# Patient Record
Sex: Male | Born: 1935 | Race: White | Hispanic: No | Marital: Married | Smoking: Former smoker
Health system: Southern US, Community
[De-identification: ages and names within clinical notes are randomized; demographics above are authoritative.]

## PROBLEM LIST (undated history)

## (undated) DIAGNOSIS — I10 Essential (primary) hypertension: Secondary | ICD-10-CM

## (undated) DIAGNOSIS — E785 Hyperlipidemia, unspecified: Secondary | ICD-10-CM

## (undated) HISTORY — DX: Essential (primary) hypertension: I10

## (undated) HISTORY — PX: CATARACT EXTRACTION: SUR2

## (undated) HISTORY — DX: Hyperlipidemia, unspecified: E78.5

## (undated) HISTORY — PX: TONSILLECTOMY AND ADENOIDECTOMY: SHX28

---

## 1999-05-06 HISTORY — PX: OTHER SURGICAL HISTORY: SHX169

## 1999-06-07 HISTORY — PX: PTCA: SHX146

## 2007-03-25 ENCOUNTER — Ambulatory Visit: Payer: Self-pay | Admitting: Orthopaedic Surgery

## 2009-07-23 ENCOUNTER — Inpatient Hospital Stay: Payer: Self-pay | Admitting: Surgery

## 2009-07-27 HISTORY — PX: ERCP: SHX60

## 2010-06-06 ENCOUNTER — Emergency Department: Payer: Self-pay | Admitting: Emergency Medicine

## 2011-09-05 DIAGNOSIS — K4090 Unilateral inguinal hernia, without obstruction or gangrene, not specified as recurrent: Secondary | ICD-10-CM | POA: Insufficient documentation

## 2012-08-05 DIAGNOSIS — H2510 Age-related nuclear cataract, unspecified eye: Secondary | ICD-10-CM | POA: Diagnosis not present

## 2012-09-06 DIAGNOSIS — E785 Hyperlipidemia, unspecified: Secondary | ICD-10-CM | POA: Diagnosis not present

## 2012-09-06 DIAGNOSIS — Z0000 Encounter for general adult medical examination without abnormal findings: Secondary | ICD-10-CM | POA: Diagnosis not present

## 2012-09-06 DIAGNOSIS — H6120 Impacted cerumen, unspecified ear: Secondary | ICD-10-CM | POA: Diagnosis not present

## 2012-09-06 DIAGNOSIS — Z23 Encounter for immunization: Secondary | ICD-10-CM | POA: Diagnosis not present

## 2012-09-06 DIAGNOSIS — I10 Essential (primary) hypertension: Secondary | ICD-10-CM | POA: Diagnosis not present

## 2012-09-06 DIAGNOSIS — I2510 Atherosclerotic heart disease of native coronary artery without angina pectoris: Secondary | ICD-10-CM | POA: Diagnosis not present

## 2012-09-09 DIAGNOSIS — E785 Hyperlipidemia, unspecified: Secondary | ICD-10-CM | POA: Diagnosis not present

## 2012-09-09 DIAGNOSIS — Z125 Encounter for screening for malignant neoplasm of prostate: Secondary | ICD-10-CM | POA: Diagnosis not present

## 2012-09-25 DIAGNOSIS — H2510 Age-related nuclear cataract, unspecified eye: Secondary | ICD-10-CM | POA: Diagnosis not present

## 2012-10-09 ENCOUNTER — Ambulatory Visit: Payer: Self-pay | Admitting: Ophthalmology

## 2012-10-09 DIAGNOSIS — H2510 Age-related nuclear cataract, unspecified eye: Secondary | ICD-10-CM | POA: Diagnosis not present

## 2012-10-09 DIAGNOSIS — Z87891 Personal history of nicotine dependence: Secondary | ICD-10-CM | POA: Diagnosis not present

## 2012-10-09 DIAGNOSIS — Z79899 Other long term (current) drug therapy: Secondary | ICD-10-CM | POA: Diagnosis not present

## 2012-10-09 DIAGNOSIS — Z7982 Long term (current) use of aspirin: Secondary | ICD-10-CM | POA: Diagnosis not present

## 2012-10-09 DIAGNOSIS — Z880 Allergy status to penicillin: Secondary | ICD-10-CM | POA: Diagnosis not present

## 2012-10-09 DIAGNOSIS — I10 Essential (primary) hypertension: Secondary | ICD-10-CM | POA: Diagnosis not present

## 2012-10-09 DIAGNOSIS — Z9861 Coronary angioplasty status: Secondary | ICD-10-CM | POA: Diagnosis not present

## 2012-10-09 DIAGNOSIS — I2510 Atherosclerotic heart disease of native coronary artery without angina pectoris: Secondary | ICD-10-CM | POA: Diagnosis not present

## 2012-10-09 DIAGNOSIS — E7800 Pure hypercholesterolemia, unspecified: Secondary | ICD-10-CM | POA: Diagnosis not present

## 2013-04-25 DIAGNOSIS — H2510 Age-related nuclear cataract, unspecified eye: Secondary | ICD-10-CM | POA: Diagnosis not present

## 2013-06-17 DIAGNOSIS — H2510 Age-related nuclear cataract, unspecified eye: Secondary | ICD-10-CM | POA: Diagnosis not present

## 2013-06-25 ENCOUNTER — Ambulatory Visit: Payer: Self-pay | Admitting: Ophthalmology

## 2013-06-25 DIAGNOSIS — Z9861 Coronary angioplasty status: Secondary | ICD-10-CM | POA: Diagnosis not present

## 2013-06-25 DIAGNOSIS — H269 Unspecified cataract: Secondary | ICD-10-CM | POA: Diagnosis not present

## 2013-06-25 DIAGNOSIS — I2510 Atherosclerotic heart disease of native coronary artery without angina pectoris: Secondary | ICD-10-CM | POA: Diagnosis not present

## 2013-06-25 DIAGNOSIS — E7800 Pure hypercholesterolemia, unspecified: Secondary | ICD-10-CM | POA: Diagnosis not present

## 2013-06-25 DIAGNOSIS — H2510 Age-related nuclear cataract, unspecified eye: Secondary | ICD-10-CM | POA: Diagnosis not present

## 2013-06-25 DIAGNOSIS — Z79899 Other long term (current) drug therapy: Secondary | ICD-10-CM | POA: Diagnosis not present

## 2013-06-25 DIAGNOSIS — Z87891 Personal history of nicotine dependence: Secondary | ICD-10-CM | POA: Diagnosis not present

## 2013-06-25 DIAGNOSIS — I10 Essential (primary) hypertension: Secondary | ICD-10-CM | POA: Diagnosis not present

## 2013-06-25 DIAGNOSIS — Z880 Allergy status to penicillin: Secondary | ICD-10-CM | POA: Diagnosis not present

## 2013-09-09 DIAGNOSIS — E785 Hyperlipidemia, unspecified: Secondary | ICD-10-CM | POA: Diagnosis not present

## 2013-09-09 DIAGNOSIS — Z1330 Encounter for screening examination for mental health and behavioral disorders, unspecified: Secondary | ICD-10-CM | POA: Diagnosis not present

## 2013-09-09 DIAGNOSIS — Z23 Encounter for immunization: Secondary | ICD-10-CM | POA: Diagnosis not present

## 2013-09-09 DIAGNOSIS — Z1212 Encounter for screening for malignant neoplasm of rectum: Secondary | ICD-10-CM | POA: Diagnosis not present

## 2013-09-09 DIAGNOSIS — I10 Essential (primary) hypertension: Secondary | ICD-10-CM | POA: Diagnosis not present

## 2013-09-09 DIAGNOSIS — H6120 Impacted cerumen, unspecified ear: Secondary | ICD-10-CM | POA: Diagnosis not present

## 2013-09-09 DIAGNOSIS — Z0000 Encounter for general adult medical examination without abnormal findings: Secondary | ICD-10-CM | POA: Diagnosis not present

## 2013-09-09 DIAGNOSIS — Z1331 Encounter for screening for depression: Secondary | ICD-10-CM | POA: Diagnosis not present

## 2013-10-07 DIAGNOSIS — Z1331 Encounter for screening for depression: Secondary | ICD-10-CM | POA: Diagnosis not present

## 2013-10-07 DIAGNOSIS — Z1212 Encounter for screening for malignant neoplasm of rectum: Secondary | ICD-10-CM | POA: Diagnosis not present

## 2013-10-07 DIAGNOSIS — E785 Hyperlipidemia, unspecified: Secondary | ICD-10-CM | POA: Diagnosis not present

## 2013-10-07 DIAGNOSIS — J069 Acute upper respiratory infection, unspecified: Secondary | ICD-10-CM | POA: Diagnosis not present

## 2013-10-07 DIAGNOSIS — Z1330 Encounter for screening examination for mental health and behavioral disorders, unspecified: Secondary | ICD-10-CM | POA: Diagnosis not present

## 2013-10-07 DIAGNOSIS — Z0000 Encounter for general adult medical examination without abnormal findings: Secondary | ICD-10-CM | POA: Diagnosis not present

## 2013-10-07 DIAGNOSIS — I10 Essential (primary) hypertension: Secondary | ICD-10-CM | POA: Diagnosis not present

## 2014-02-02 DIAGNOSIS — Z961 Presence of intraocular lens: Secondary | ICD-10-CM | POA: Diagnosis not present

## 2014-02-02 DIAGNOSIS — H2510 Age-related nuclear cataract, unspecified eye: Secondary | ICD-10-CM | POA: Diagnosis not present

## 2014-09-11 DIAGNOSIS — Z23 Encounter for immunization: Secondary | ICD-10-CM | POA: Diagnosis not present

## 2014-09-11 DIAGNOSIS — Z1212 Encounter for screening for malignant neoplasm of rectum: Secondary | ICD-10-CM | POA: Diagnosis not present

## 2014-09-11 DIAGNOSIS — I10 Essential (primary) hypertension: Secondary | ICD-10-CM | POA: Diagnosis not present

## 2014-09-11 DIAGNOSIS — Z0000 Encounter for general adult medical examination without abnormal findings: Secondary | ICD-10-CM | POA: Diagnosis not present

## 2014-09-11 DIAGNOSIS — E785 Hyperlipidemia, unspecified: Secondary | ICD-10-CM | POA: Diagnosis not present

## 2014-09-11 DIAGNOSIS — Z1389 Encounter for screening for other disorder: Secondary | ICD-10-CM | POA: Diagnosis not present

## 2014-09-11 DIAGNOSIS — I2510 Atherosclerotic heart disease of native coronary artery without angina pectoris: Secondary | ICD-10-CM | POA: Diagnosis not present

## 2014-09-23 DIAGNOSIS — E785 Hyperlipidemia, unspecified: Secondary | ICD-10-CM | POA: Diagnosis not present

## 2014-09-23 DIAGNOSIS — I10 Essential (primary) hypertension: Secondary | ICD-10-CM | POA: Diagnosis not present

## 2014-09-23 LAB — LIPID PANEL
CHOLESTEROL: 149 mg/dL (ref 0–200)
HDL: 40 mg/dL (ref 35–70)
LDL Cholesterol: 82 mg/dL
TRIGLYCERIDES: 135 mg/dL (ref 40–160)

## 2014-09-23 LAB — BASIC METABOLIC PANEL
BUN: 21 mg/dL (ref 4–21)
Creatinine: 1 mg/dL (ref 0.6–1.3)
GLUCOSE: 98 mg/dL
POTASSIUM: 4.3 mmol/L (ref 3.4–5.3)
Sodium: 139 mmol/L (ref 137–147)

## 2015-08-02 DIAGNOSIS — Z23 Encounter for immunization: Secondary | ICD-10-CM | POA: Diagnosis not present

## 2015-09-13 DIAGNOSIS — I10 Essential (primary) hypertension: Secondary | ICD-10-CM | POA: Insufficient documentation

## 2015-09-13 DIAGNOSIS — N400 Enlarged prostate without lower urinary tract symptoms: Secondary | ICD-10-CM | POA: Insufficient documentation

## 2015-09-13 DIAGNOSIS — E785 Hyperlipidemia, unspecified: Secondary | ICD-10-CM | POA: Insufficient documentation

## 2015-09-13 DIAGNOSIS — K8050 Calculus of bile duct without cholangitis or cholecystitis without obstruction: Secondary | ICD-10-CM | POA: Insufficient documentation

## 2015-09-13 DIAGNOSIS — I2510 Atherosclerotic heart disease of native coronary artery without angina pectoris: Secondary | ICD-10-CM | POA: Insufficient documentation

## 2015-09-15 ENCOUNTER — Encounter: Payer: Self-pay | Admitting: Family Medicine

## 2015-09-15 ENCOUNTER — Ambulatory Visit (INDEPENDENT_AMBULATORY_CARE_PROVIDER_SITE_OTHER): Payer: Medicare Other | Admitting: Family Medicine

## 2015-09-15 VITALS — BP 144/76 | HR 64 | Temp 97.5°F | Resp 16 | Ht 71.0 in | Wt 167.0 lb

## 2015-09-15 DIAGNOSIS — I2510 Atherosclerotic heart disease of native coronary artery without angina pectoris: Secondary | ICD-10-CM

## 2015-09-15 DIAGNOSIS — E785 Hyperlipidemia, unspecified: Secondary | ICD-10-CM

## 2015-09-15 DIAGNOSIS — I10 Essential (primary) hypertension: Secondary | ICD-10-CM | POA: Diagnosis not present

## 2015-09-15 DIAGNOSIS — R42 Dizziness and giddiness: Secondary | ICD-10-CM | POA: Diagnosis not present

## 2015-09-15 DIAGNOSIS — Z0000 Encounter for general adult medical examination without abnormal findings: Secondary | ICD-10-CM

## 2015-09-15 NOTE — Progress Notes (Signed)
Patient: Benjamin Walls, Male    DOB: 10-06-1930, 81 y.o.   MRN: [id number] Visit Date: 09/15/2015  Today's Provider: Lelon Huh, MD   Chief Complaint  Patient presents with  . Medicare Wellness  . Coronary Artery Disease    follow up  . Hypertension    follow up  . Hyperlipidemia    follow up   Subjective:    Annual wellness visit Benjamin Walls is a 81 y.o. male. He feels well. He reports exercising daily (walks 1 mile or more daily). He reports he is sleeping poorly.  -----------------------------------------------------------  Hypertension, follow-up:  BP Readings from Last 3 Encounters:  09/11/14 128/70    He was last seen for hypertension 1 years ago.  BP at that visit was 128/70. Management since that visit includes no changes. He reports good compliance with treatment. He is not having side effects.  He is exercising. He is adherent to low salt diet.   Outside blood pressures are 209-470 systolic over 96-28 diastolic. He is experiencing none.  Patient denies chest pain, chest pressure/discomfort, claudication, dyspnea, exertional chest pressure/discomfort, fatigue, irregular heart beat, lower extremity edema, near-syncope, orthopnea, palpitations, paroxysmal nocturnal dyspnea, syncope and tachypnea.   Cardiovascular risk factors include advanced age (older than 47 for men, 39 for women), hypertension and male gender.  Use of agents associated with hypertension: NSAIDS.     Weight trend: stable Wt Readings from Last 3 Encounters:  09/11/14 167 lb (75.751 kg)    Current diet: in general, a "healthy" diet    ------------------------------------------------------------------------   Lipid/Cholesterol, Follow-up:   Last seen for this1 years ago.  Management changes since that visit include no changes. . Last Lipid Panel:    Component Value Date/Time   CHOL 149 09/23/2014   TRIG 135 09/23/2014   HDL 40 09/23/2014   LDLCALC 82 09/23/2014      Risk factors for vascular disease include hypertension  He reports good compliance with treatment. He is not having side effects.  Current symptoms include none and have been stable. Weight trend: stable Prior visit with dietician: no Current diet: in general, a "healthy" diet   Current exercise: walks 1 mile daily  Wt Readings from Last 3 Encounters:  09/11/14 167 lb (75.751 kg)    ------------------------------------------------------------------- CAD Follow up: Last office visit was 1 year ago and no changes were made. Feels well with no chest pains, dyspnea or palpitations.   Review of Systems  Constitutional: Negative for fever, chills, appetite change and fatigue.  HENT: Negative for congestion, ear pain, hearing loss, nosebleeds and trouble swallowing.   Eyes: Negative for pain and visual disturbance.  Respiratory: Negative for cough, chest tightness and shortness of breath.   Cardiovascular: Negative for chest pain, palpitations and leg swelling.  Gastrointestinal: Negative for nausea, vomiting, abdominal pain, diarrhea, constipation and blood in stool.  Endocrine: Negative for polydipsia, polyphagia and polyuria.  Genitourinary: Negative for dysuria and flank pain.  Musculoskeletal: Negative for myalgias, back pain, joint swelling, arthralgias and neck stiffness.  Skin: Negative for color change, rash and wound.  Neurological: Positive for dizziness (occurs occasionally when he turns his head or stands up quickly). Negative for tremors, seizures, speech difficulty, weakness, light-headedness and headaches.  Psychiatric/Behavioral: Positive for sleep disturbance. Negative for behavioral problems, confusion, dysphoric mood and decreased concentration. The patient is not nervous/anxious.   All other systems reviewed and are negative.   Social History   Social History  . Marital Status:  Married    Spouse Name: N/A  . Number of Children: 2  . Years of Education: N/A    Occupational History  . 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 Smoking status: Former Smoker    Quit date: 11/06/1962  . Smokeless tobacco: Not on file  . Alcohol Use: 0.0 oz/week    0 Standard drinks or equivalent per week     Comment: 2 glasses of wine 3 times weekly  . Drug Use: No  . Sexual Activity: Not on file   Other Topics Concern  . Not on file   Social History Narrative    Patient Active Problem List   Diagnosis Date Noted  . CAD (coronary artery disease) 09/13/2015  . Choledocholithiasis 09/13/2015  . HLD (hyperlipidemia) 09/13/2015  . Hypertension 09/13/2015  . BPH (benign prostatic hypertrophy) 09/13/2015  . Hernia, inguinal, left 09/05/2011    Past Surgical History  Procedure Laterality Date  . Ptca  06/1999    STENT  . Cataract extraction  10/09/2012 and 04/2013  . Tonsillectomy and adenoidectomy    . Ercp  07/27/2009    Impression: Prescence of pancreatic stent. This was found to be patent. -Cholendocholithiasis was found. Complete removal was accomplished by billiary sphincterotomy.- Dilated CBD with single large stone, which was extracted with ballone.   . Exercise stress test  05/06/1999    His family history includes Heart Problems in his father; Hypertension in his mother.    Previous Medications   ASPIRIN 81 MG TABLET    Take 1 tablet by mouth daily.   LOSARTAN-HYDROCHLOROTHIAZIDE (HYZAAR) 100-25 MG TABLET    Take 1 tablet by mouth daily.   LOVASTATIN (MEVACOR) 20 MG TABLET    Take 1 tablet by mouth daily.    Patient Care Team: Birdie Sons, MD as PCP - General (Family Medicine)     Objective:   Vitals: BP 144/76 mmHg  Pulse 64  Temp(Src) 97.5 F (36.4 C) (Oral)  Resp 16  Ht 5\' 11"  (1.803 m)  Wt 167 lb (75.751 kg)  BMI 23.30 kg/m2  SpO2 95%  Physical Exam   General Appearance:    Alert, cooperative, no distress, appears stated age  Head:     Normocephalic, without obvious abnormality, atraumatic  Eyes:    PERRL, conjunctiva/corneas clear, EOM's intact, fundi    benign, both eyes       Ears:    Normal TM's and external ear canals, both ears  Nose:   Nares normal, septum midline, mucosa normal, no drainage   or sinus tenderness  Throat:   Lips, mucosa, and tongue normal; teeth and gums normal  Neck:   Supple, symmetrical, trachea midline, no adenopathy;       thyroid:  No enlargement/tenderness/nodules; no carotid   bruit or JVD  Back:     Symmetric, no curvature, ROM normal, no CVA tenderness  Lungs:     Clear to auscultation bilaterally, respirations unlabored  Chest wall:    No tenderness or deformity  Heart:    Regular rate and rhythm, S1 and S2 normal, no murmur, rub   or gallop  Abdomen:     Soft, non-tender, bowel sounds active all four quadrants,    no masses, no organomegaly  Genitalia:    deferred  Rectal:    deferred  Extremities:   Extremities normal, atraumatic, no cyanosis or edema  Pulses:   2+  and symmetric all extremities  Skin:   Skin color, texture, turgor normal, no rashes or lesions  Lymph nodes:   Cervical, supraclavicular, and axillary nodes normal  Neurologic:   CNII-XII intact. Normal strength, sensation and reflexes      throughout     Activities of Daily Living In your present state of health, do you have any difficulty performing the following activities: 09/15/2015  Hearing? Y  Vision? N  Difficulty concentrating or making decisions? N  Walking or climbing stairs? N  Dressing or bathing? N  Doing errands, shopping? N    Fall Risk Assessment Fall Risk  09/15/2015  Falls in the past year? No     Depression Screen PHQ 2/9 Scores 09/15/2015  PHQ - 2 Score 0    Cognitive Testing - 6-CIT  Correct? Score   What year is it? yes 0 0 or 4  What month is it? yes 0 0 or 3  Memorize:    Pia Mau,  [birth year],  [place],      What time is it? (within 1 hour) yes 0 0 or 3  Count  backwards from 20 yes 0 0, 2, or 4  Name the months of the year yes 0 0, 2, or 4  Repeat name & address above yes 0 0, 2, 4, 6, 8, or 10       TOTAL SCORE  0/28   Interpretation:  Normal  Normal (0-7) Abnormal (8-28)   Audit-C Alcohol Use Screening  Question Answer Points  How often do you have alcoholic drink? 2-3 times weekly 3  On days you do drink alcohol, how many drinks do you typically consume? 1 or 2 0  How oftey will you drink 6 or more in a total? never 0  Total Score:  3   A score of 3 or more in women, and 4 or more in men indicates increased risk for alcohol abuse, EXCEPT if all of the points are from question 1.   Assessment & Plan:     Annual Wellness Visit  Reviewed patient's Family Medical History Reviewed and updated list of patient's medical providers Assessment of cognitive impairment was done Assessed patient's functional ability Established a written schedule for health screening [place] Completed and Reviewed  Exercise Activities and Dietary recommendations Goals    None      Immunization History  Administered Date(s) Administered  . Pneumococcal Conjugate-13 09/11/2014  . Pneumococcal Polysaccharide-23 09/02/2008  . Tdap 09/05/2011  . Zoster 08/30/2010    Health Maintenance  Topic Date Due  . INFLUENZA VACCINE  06/07/2015  . TETANUS/TDAP  09/04/2021  . ZOSTAVAX  Completed  . PNA vac Low Risk Adult  Completed      Discussed health benefits of physical activity, and encouraged him to engage in regular exercise appropriate for his age and condition.    ------------------------------------------------------------------------------------------------------------  1. Medicare annual wellness visit, subsequent Doing well. Was given flu vaccine [facility]  2. Coronary artery disease involving native coronary artery of native heart without angina pectoris Asymptomatic. Compliant with medication.  Continue aggressive  risk factor modification.   - EKG 12-Lead  3. HLD (hyperlipidemia) He is tolerating lovastatin well with no adverse effects.   - Lipid panel  4. Essential hypertension Well controlled.  Continue current medications.    5. Dizziness Mild and intermittent associated with rapid head movement, likely mild vertigo. Check labs.  - CBC - Comprehensive metabolic panel

## 2015-09-16 LAB — COMPREHENSIVE METABOLIC PANEL
ALBUMIN: 4.4 g/dL (ref 3.5–4.7)
ALT: 19 IU/L (ref 0–44)
AST: 29 IU/L (ref 0–40)
Albumin/Globulin Ratio: 1.9 (ref 1.1–2.5)
Alkaline Phosphatase: 74 IU/L (ref 39–117)
BILIRUBIN TOTAL: 0.7 mg/dL (ref 0.0–1.2)
BUN/Creatinine Ratio: 18 (ref 10–22)
BUN: 16 mg/dL (ref 8–27)
CO2: 29 mmol/L (ref 18–29)
Calcium: 9.4 mg/dL (ref 8.6–10.2)
Chloride: 95 mmol/L — ABNORMAL LOW (ref 97–106)
Creatinine, Ser: 0.87 mg/dL (ref 0.76–1.27)
GFR calc non Af Amer: 80 mL/min/{1.73_m2} (ref >59)
GFR, EST AFRICAN AMERICAN: 92 mL/min/{1.73_m2} (ref >59)
Globulin, Total: 2.3 g/dL (ref 1.5–4.5)
Glucose: 97 mg/dL (ref 65–99)
POTASSIUM: 4 mmol/L (ref 3.5–5.2)
Sodium: 137 mmol/L (ref 136–144)
Total Protein: 6.7 g/dL (ref 6.0–8.5)

## 2015-09-16 LAB — CBC
HEMATOCRIT: 43.4 % (ref 37.5–51.0)
HEMOGLOBIN: 15.1 g/dL (ref 12.6–17.7)
MCH: 31.1 pg (ref 26.6–33.0)
MCHC: 34.8 g/dL (ref 31.5–35.7)
MCV: 90 fL (ref 79–97)
Platelets: 145 10*3/uL — ABNORMAL LOW (ref 150–379)
RBC: 4.85 x10E6/uL (ref 4.14–5.80)
RDW: 13.9 % (ref 12.3–15.4)
WBC: 6.3 10*3/uL (ref 3.4–10.8)

## 2015-09-16 LAB — LIPID PANEL
CHOL/HDL RATIO: 4 ratio (ref 0.0–5.0)
Cholesterol, Total: 136 mg/dL (ref 100–199)
HDL: 34 mg/dL — ABNORMAL LOW (ref >39)
LDL Calculated: 75 mg/dL (ref 0–99)
Triglycerides: 136 mg/dL (ref 0–149)
VLDL Cholesterol Cal: 27 mg/dL (ref 5–40)

## 2015-09-17 ENCOUNTER — Encounter: Payer: Self-pay | Admitting: Family Medicine

## 2015-10-29 ENCOUNTER — Other Ambulatory Visit: Payer: Self-pay | Admitting: Family Medicine

## 2015-10-29 DIAGNOSIS — I10 Essential (primary) hypertension: Secondary | ICD-10-CM

## 2015-12-14 ENCOUNTER — Other Ambulatory Visit: Payer: Self-pay | Admitting: Family Medicine

## 2016-04-18 ENCOUNTER — Encounter: Payer: Self-pay | Admitting: Family Medicine

## 2016-04-18 ENCOUNTER — Ambulatory Visit (INDEPENDENT_AMBULATORY_CARE_PROVIDER_SITE_OTHER): Payer: Medicare Other | Admitting: Family Medicine

## 2016-04-18 VITALS — BP 130/68 | HR 65 | Temp 97.7°F | Resp 16 | Wt 165.0 lb

## 2016-04-18 DIAGNOSIS — H6121 Impacted cerumen, right ear: Secondary | ICD-10-CM | POA: Diagnosis not present

## 2016-04-18 NOTE — Progress Notes (Signed)
       Patient: Benjamin Walls Male    DOB: 1930-04-08   82 y.o.   MRN: [id number] Visit Date: 04/18/2016  Today's Provider: Lelon Huh, MD   Chief Complaint  Patient presents with  . Ear Fullness   Subjective:    Ear Fullness  There is pain in the right ear. This is a new problem. Episode onset: 2 days ago. The problem occurs constantly. The problem has been gradually worsening. There has been no fever. The patient is experiencing no pain. Associated symptoms include hearing loss (decreased hearing in right ear). Pertinent negatives include no abdominal pain, coughing, diarrhea, ear discharge, headaches, neck pain, rash, rhinorrhea, sore throat or vomiting. He has tried ear drops for the symptoms. The treatment provided mild relief.       Allergies  Allergen Reactions  . Lotensin  [Benazepril Hcl]   . Penicillins    Current Meds  Medication Sig  . aspirin 81 MG tablet Take 1 tablet by mouth daily.  Marland Kitchen losartan-hydrochlorothiazide (HYZAAR) 100-25 MG tablet TAKE 1 TABLET BY MOUTH EVERY DAY  . lovastatin (MEVACOR) 20 MG tablet TAKE 1 TABLET BY MOUTH EVERY DAY    Review of Systems  Constitutional: Negative for fever, chills and appetite change.  HENT: Positive for hearing loss (decreased hearing in right ear). Negative for ear discharge, ear pain, mouth sores, nosebleeds, postnasal drip, rhinorrhea, sinus pressure, sneezing, sore throat and tinnitus.        Fullness in right ear  Eyes: Negative for photophobia, pain, discharge, redness, itching and visual disturbance.  Respiratory: Negative for cough, chest tightness, shortness of breath and wheezing.   Cardiovascular: Negative for chest pain and palpitations.  Gastrointestinal: Negative for nausea, vomiting, abdominal pain and diarrhea.  Musculoskeletal: Negative for neck pain.  Skin: Negative for rash.  Neurological: Negative for headaches.    Social History  Substance Use Topics  . Smoking status: Former Smoker   Quit date: 11/06/1962  . Smokeless tobacco: Not on file  . Alcohol Use: 0.0 oz/week    0 Standard drinks or equivalent per week     Comment: 2 glasses of wine 3 times weekly   Objective:   BP 130/68 mmHg  Pulse 65  Temp(Src) 97.7 F (36.5 C) (Oral)  Resp 16  Wt 165 lb (74.844 kg)  SpO2 96%  Physical Exam  General Appearance:    Alert, cooperative, no distress  HENT:   bilateral TM normal without fluid or infection Right canal obstructed with cerumen.        Assessment & Plan:     1. Cerumen impaction, right After soaking with Debrox, ear canal was irrigated with water until clear. Patient tolerated procedure well.       The entirety of the information documented in the History of Present Illness, Review of Systems and Physical Exam were personally obtained by me. Portions of this information were initially documented by Meyer Cory, CMA and reviewed by me for thoroughness and accuracy.  Return in about 5 months (around 09/18/2016) for Yearly Physical.  Lelon Huh, MD  [facility]

## 2016-09-18 ENCOUNTER — Encounter: Payer: Self-pay | Admitting: Family Medicine

## 2016-09-18 ENCOUNTER — Ambulatory Visit (INDEPENDENT_AMBULATORY_CARE_PROVIDER_SITE_OTHER): Payer: Medicare Other | Admitting: Family Medicine

## 2016-09-18 VITALS — BP 148/68 | HR 75 | Temp 97.6°F | Resp 16 | Ht 71.0 in | Wt 164.0 lb

## 2016-09-18 DIAGNOSIS — E785 Hyperlipidemia, unspecified: Secondary | ICD-10-CM

## 2016-09-18 DIAGNOSIS — Z23 Encounter for immunization: Secondary | ICD-10-CM | POA: Diagnosis not present

## 2016-09-18 DIAGNOSIS — I2510 Atherosclerotic heart disease of native coronary artery without angina pectoris: Secondary | ICD-10-CM

## 2016-09-18 DIAGNOSIS — I10 Essential (primary) hypertension: Secondary | ICD-10-CM | POA: Diagnosis not present

## 2016-09-18 DIAGNOSIS — Z0000 Encounter for general adult medical examination without abnormal findings: Secondary | ICD-10-CM

## 2016-09-18 NOTE — Progress Notes (Signed)
Patient: Benjamin Walls, Male    DOB: 04/23/1930, 82 y.o.   MRN: [id number] Visit Date: 09/18/2016  Today's Provider: Lelon Huh, MD   Chief Complaint  Patient presents with  . Medicare Wellness  . Hypertension    follow up  . Hyperlipidemia    follow up  . Coronary Artery Disease    follow up   Subjective:    Annual wellness visit Benjamin Walls is a 82 y.o. male. He feels fairly well. He reports exercising daily. He reports he is sleeping fairly well.  -----------------------------------------------------------  Hypertension, follow-up:  BP Readings from Last 3 Encounters:  04/18/16 130/68  09/15/15 (!) 144/76  09/11/14 128/70    He was last seen for hypertension 1 years ago.  BP at that visit was 144/76. Management since that visit includes no changes. He reports good compliance with treatment. He is not having side effects.  He is exercising. He is adherent to low salt diet.   Outside blood pressures are normal per patient. He is experiencing none.  Patient denies chest pain, chest pressure/discomfort, claudication, dyspnea, exertional chest pressure/discomfort, fatigue, irregular heart beat, lower extremity edema, near-syncope, orthopnea, palpitations, paroxysmal nocturnal dyspnea, syncope and tachypnea.   Cardiovascular risk factors include dyslipidemia and hypertension.  Use of agents associated with hypertension: NSAIDS.     Weight trend: stable Wt Readings from Last 3 Encounters:  04/18/16 165 lb (74.8 kg)  09/15/15 167 lb (75.8 kg)  09/11/14 167 lb (75.8 kg)    Current diet: in general, a "healthy" diet    ------------------------------------------------------------------------  Lipid/Cholesterol, Follow-up:   Last seen for this1 years ago.  Management changes since that visit include none. . Last Lipid Panel:    Component Value Date/Time   CHOL 136 09/15/2015 1008   TRIG 136 09/15/2015 1008   HDL 34 (L) 09/15/2015 1008   CHOLHDL 4.0 09/15/2015 1008   [place] 75 09/15/2015 1008    Risk factors for vascular disease include hypercholesterolemia and hypertension  He reports good compliance with treatment. He is not having side effects.  Current symptoms include none and have been stable. Weight trend: stable Prior visit with dietician: no Current diet: in general, a "healthy" diet   Current exercise: walking  Wt Readings from Last 3 Encounters:  04/18/16 165 lb (74.8 kg)  09/15/15 167 lb (75.8 kg)  09/11/14 167 lb (75.8 kg)    ------------------------------------------------------------------- Follow up CAD:  Patient was last seen 1 year ago and no changes were made.   Review of Systems  Constitutional: Negative for appetite change, chills, fatigue and fever.  HENT: Negative for congestion, ear pain, hearing loss, nosebleeds and trouble swallowing.   Eyes: Negative for pain and visual disturbance.  Respiratory: Negative for cough, chest tightness and shortness of breath.   Cardiovascular: Negative for chest pain, palpitations and leg swelling.  Gastrointestinal: Negative for abdominal pain, blood in stool, constipation, diarrhea, nausea and vomiting.  Endocrine: Negative for polydipsia, polyphagia and polyuria.  Genitourinary: Negative for dysuria, flank pain and penile swelling.  Musculoskeletal: Negative for arthralgias, back pain, joint swelling, myalgias and neck stiffness.  Skin: Negative for color change, rash and wound.  Neurological: Negative for dizziness, tremors, seizures, speech difficulty, weakness, light-headedness and headaches.  Psychiatric/Behavioral: Negative for behavioral problems, confusion, decreased concentration, dysphoric mood and sleep disturbance. The patient is not nervous/anxious.   All other systems reviewed and are negative.   Social History   Social History  . Marital status: Married  Spouse name: N/A  . Number of children: 2  . Years of education: N/A    Occupational History  . 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 Smoking status: Former Smoker    Quit date: 11/06/1962  . Smokeless tobacco: Never Used  . Alcohol use 0.0 oz/week     Comment: 2 glasses of wine 3 times weekly  . Drug use: No  . Sexual activity: Not on file   Other Topics Concern  . Not on file   Social History Narrative  . No narrative on file    History reviewed. No pertinent past medical history.   Patient Active Problem List   Diagnosis Date Noted  . CAD (coronary artery disease) 09/13/2015  . Choledocholithiasis 09/13/2015  . HLD (hyperlipidemia) 09/13/2015  . Hypertension 09/13/2015  . BPH (benign prostatic hypertrophy) 09/13/2015  . Hernia, inguinal, left 09/05/2011    Past Surgical History:  Procedure Laterality Date  . CATARACT EXTRACTION  10/09/2012 and 04/2013  . ERCP  07/27/2009   Impression: Prescence of pancreatic stent. This was found to be patent. -Cholendocholithiasis was found. Complete removal was accomplished by billiary sphincterotomy.- Dilated CBD with single large stone, which was extracted with ballone.   . Exercise Stress test  05/06/1999  . PTCA  06/1999   STENT  . TONSILLECTOMY AND ADENOIDECTOMY      His family history includes Heart Problems in his father; Hypertension in his mother.     Current Meds  Medication Sig  . aspirin 81 MG tablet Take 1 tablet by mouth daily.  Marland Kitchen losartan-hydrochlorothiazide (HYZAAR) 100-25 MG tablet TAKE 1 TABLET BY MOUTH EVERY DAY  . lovastatin (MEVACOR) 20 MG tablet TAKE 1 TABLET BY MOUTH EVERY DAY    Patient Care Team: Birdie Sons, MD as PCP - General (Family Medicine)     Objective:   Vitals: BP (!) 148/68 (BP Location: Left Arm, Patient Position: Sitting, Cuff Size: Normal)   Pulse 75   Temp 97.6 F (36.4 C) (Oral)   Resp 16   Ht 5\' 11"  (1.803 m)   Wt 164 lb (74.4 kg)   BMI 22.87 kg/m   Physical  Exam  Activities of Daily Living In your present state of health, do you have any difficulty performing the following activities: 09/18/2016  Hearing? N  Vision? N  Difficulty concentrating or making decisions? N  Walking or climbing stairs? N  Dressing or bathing? N  Doing errands, shopping? N  Some recent data might be hidden    Fall Risk Assessment Fall Risk  09/18/2016 09/15/2015  Falls in the past year? No No     Depression Screen PHQ 2/9 Scores 09/18/2016 09/15/2015  PHQ - 2 Score 0 0    Cognitive Testing - 6-CIT  Correct? Score   What year is it? yes 0 0 or 4  What month is it? yes 0 0 or 3  Memorize:    Pia Mau,  44,  [place],      What time is it? (within 1 hour) yes 0 0 or 3  Count backwards from 20 yes 0 0, 2, or 4  Name the months of the year yes 0 0, 2, or 4  Repeat name & address above no 3 0, 2, 4, 6, 8, or 10       TOTAL SCORE  0/28   Interpretation:  Normal  Normal (0-7) Abnormal (8-28)   Audit-C Alcohol Use Screening  Question Answer Points  How often do you have alcoholic drink? 2-3 times weekly 3  On days you do drink alcohol, how many drinks do you typically consume? 1 or 2 0  How oftey will you drink 6 or more in a total? never 0  Total Score:  3   A score of 3 or more in women, and 4 or more in men indicates increased risk for alcohol abuse, EXCEPT if all of the points are from question 1.  Current Exercise Habits: Home exercise routine, Type of exercise: walking, Time (Minutes): 30, Frequency (Times/Week): 7, Weekly Exercise (Minutes/Week): 210, Intensity: Mild Exercise limited by: None identified    Assessment & Plan:     Annual Wellness Visit  Reviewed patient's Family Medical History Reviewed and updated list of patient's medical providers Assessment of cognitive impairment was done Assessed patient's functional ability Established a written schedule for health screening [place] Completed and  Reviewed  Exercise Activities and Dietary recommendations Goals    None      Immunization History  Administered Date(s) Administered  . Pneumococcal Conjugate-13 09/11/2014  . Pneumococcal Polysaccharide-23 09/02/2008  . Tdap 09/05/2011  . Zoster 08/30/2010    Health Maintenance  Topic Date Due  . INFLUENZA VACCINE  06/06/2016  . TETANUS/TDAP  09/04/2021  . ZOSTAVAX  Completed  . PNA vac Low Risk Adult  Completed     Discussed health benefits of physical activity, and encouraged him to engage in regular exercise appropriate for his age and condition.    ------------------------------------------------------------------------------------------------------------ I have reviewed the health advisor's note, was available for consultation, and agree with documentation and plan  Lelon Huh, MD   Lelon Huh, MD  [place]

## 2016-09-19 LAB — RENAL FUNCTION PANEL
Albumin: 4.5 g/dL (ref 3.5–4.7)
BUN/Creatinine Ratio: 25 — ABNORMAL HIGH (ref 10–24)
BUN: 20 mg/dL (ref 8–27)
CO2: 27 mmol/L (ref 18–29)
CREATININE: 0.8 mg/dL (ref 0.76–1.27)
Calcium: 9.7 mg/dL (ref 8.6–10.2)
Chloride: 99 mmol/L (ref 96–106)
GFR calc Af Amer: 95 mL/min/{1.73_m2} (ref >59)
GFR, EST NON AFRICAN AMERICAN: 82 mL/min/{1.73_m2} (ref >59)
Glucose: 98 mg/dL (ref 65–99)
PHOSPHORUS: 3.2 mg/dL (ref 2.5–4.5)
Potassium: 3.9 mmol/L (ref 3.5–5.2)
SODIUM: 140 mmol/L (ref 134–144)

## 2016-09-19 LAB — LIPID PANEL
CHOL/HDL RATIO: 3.6 ratio (ref 0.0–5.0)
Cholesterol, Total: 136 mg/dL (ref 100–199)
HDL: 38 mg/dL — AB (ref >39)
LDL CALC: 72 mg/dL (ref 0–99)
TRIGLYCERIDES: 130 mg/dL (ref 0–149)
VLDL Cholesterol Cal: 26 mg/dL (ref 5–40)

## 2016-09-20 ENCOUNTER — Telehealth: Payer: Self-pay

## 2016-09-20 NOTE — Telephone Encounter (Signed)
Patient has been advised. KW 

## 2016-09-20 NOTE — Telephone Encounter (Signed)
-----   Message from Birdie Sons, MD sent at 09/20/2016  7:50 AM EST ----- Blood sugar, kidney functions, electrolytes and cholesterol are all normal. Check labs yearly.

## 2016-09-20 NOTE — Telephone Encounter (Signed)
[id number] 

## 2016-11-20 ENCOUNTER — Other Ambulatory Visit: Payer: Self-pay | Admitting: Family Medicine

## 2016-11-20 DIAGNOSIS — I10 Essential (primary) hypertension: Secondary | ICD-10-CM

## 2016-12-16 ENCOUNTER — Other Ambulatory Visit: Payer: Self-pay | Admitting: Family Medicine

## 2017-08-07 ENCOUNTER — Ambulatory Visit: Payer: Medicare Other | Admitting: Family Medicine

## 2017-08-07 ENCOUNTER — Encounter: Payer: Self-pay | Admitting: Family Medicine

## 2017-08-07 ENCOUNTER — Ambulatory Visit (INDEPENDENT_AMBULATORY_CARE_PROVIDER_SITE_OTHER): Payer: Medicare Other | Admitting: Family Medicine

## 2017-08-07 VITALS — BP 138/68 | HR 68 | Temp 97.9°F | Resp 16 | Ht 71.0 in | Wt 163.0 lb

## 2017-08-07 DIAGNOSIS — Z23 Encounter for immunization: Secondary | ICD-10-CM | POA: Diagnosis not present

## 2017-08-07 DIAGNOSIS — R42 Dizziness and giddiness: Secondary | ICD-10-CM

## 2017-08-07 DIAGNOSIS — I10 Essential (primary) hypertension: Secondary | ICD-10-CM

## 2017-08-07 DIAGNOSIS — E785 Hyperlipidemia, unspecified: Secondary | ICD-10-CM | POA: Diagnosis not present

## 2017-08-07 NOTE — Progress Notes (Signed)
Patient: Benjamin Walls Male    DOB: 1930-08-25   83 y.o.   MRN: [id number] Visit Date: 08/07/2017  Today's Provider: Lelon Huh, MD   Chief Complaint  Patient presents with  . Hypertension  . Hyperlipidemia   Subjective:    HPI    Hypertension, follow-up:  BP Readings from Last 3 Encounters:  08/07/17 138/68  09/18/16 (!) 148/68  04/18/16 130/68    He was last seen for hypertension 1 years ago.  BP at that visit was 148/68. Management since that visit includes no changes. He reports good compliance with treatment. He is not having side effects.  He is exercising. He is adherent to low salt diet.   Outside blood pressures are checked occasionally. He is experiencing none.  Patient denies exertional chest pressure/discomfort, lower extremity edema and palpitations.   Cardiovascular risk factors include dyslipidemia.   Weight trend: stable Wt Readings from Last 3 Encounters:  08/07/17 163 lb (73.9 kg)  09/18/16 164 lb (74.4 kg)  04/18/16 165 lb (74.8 kg)    Current diet: well balanced    Lipid/Cholesterol, Follow-up:   Last seen for this1 years ago.  Management changes since that visit include no changes. . Last Lipid Panel:    Component Value Date/Time   CHOL 136 09/18/2016 1117   TRIG 130 09/18/2016 1117   HDL 38 (L) 09/18/2016 1117   CHOLHDL 3.6 09/18/2016 1117   [place] 72 09/18/2016 1117    Risk factors for vascular disease include hypertension  He reports good compliance with treatment. He is not having side effects.  Current symptoms include none and have been stable. Weight trend: stable Prior visit with dietician: no Current diet: well balanced Current exercise: walking  Wt Readings from Last 3 Encounters:  08/07/17 163 lb (73.9 kg)  09/18/16 164 lb (74.4 kg)  04/18/16 165 lb (74.8 kg)     He also reports he occasionally gets dizzy for several seconds when he stands up quickly. Doesn't happen every day. No chest pains,  palpitations, or dyspnea.     Allergies  Allergen Reactions  . Lotensin  [Benazepril Hcl]   . Penicillins      Current Outpatient Prescriptions:  .  aspirin 81 MG tablet, Take 1 tablet by mouth daily., Disp: , Rfl:  .  losartan-hydrochlorothiazide (HYZAAR) 100-25 MG tablet, TAKE 1 TABLET BY MOUTH EVERY DAY, Disp: 90 tablet, Rfl: 3 .  lovastatin (MEVACOR) 20 MG tablet, TAKE 1 TABLET BY MOUTH EVERY DAY, Disp: 30 tablet, Rfl: 12  Review of Systems  Constitutional: Negative.   Respiratory: Negative.   Cardiovascular: Negative.   Neurological: Negative.     Social History  Substance Use Topics  . Smoking status: Former Smoker    Quit date: 11/06/1962  . Smokeless tobacco: Never Used  . Alcohol use 0.0 oz/week     Comment: 2 glasses of wine 3 times weekly   Objective:   BP 138/68 (BP Location: Right Arm, Patient Position: Sitting, Cuff Size: Normal)   Pulse 68 Comment: irregular  Temp 97.9 F (36.6 C)   Resp 16   Ht 5\' 11"  (1.803 m)   Wt 163 lb (73.9 kg)   SpO2 99%   BMI 22.73 kg/m  Vitals:   08/07/17 0810  BP: 138/68  Pulse: 68  Resp: 16  Temp: 97.9 F (36.6 C)  SpO2: 99%  Weight: 163 lb (73.9 kg)  Height: 5\' 11"  (1.803 m)     Physical Exam  General Appearance:    Alert, cooperative, no distress  Eyes:    PERRL, conjunctiva/corneas clear, EOM's intact       Lungs:     Clear to auscultation bilaterally, respirations unlabored  Heart:    Regular rate and rhythm  Neurologic:   Awake, alert, oriented x 3. No apparent focal neurological           defect.       EKG: NSR    Assessment & Plan:     1. Essential hypertension Well controlled.  Continue current medications.   - COMPLETE METABOLIC PANEL WITH GFR - EKG 12-Lead  2. Hyperlipidemia, unspecified hyperlipidemia type He is tolerating lovastatin well with no adverse effects.   - COMPLETE METABOLIC PANEL WITH GFR - Lipid panel  3. Dizziness Likely mild orthostasis, encouraged to increase fluid  consumption.  - COMPLETE METABOLIC PANEL WITH GFR - CBC - EKG 12-Lead  4. Influenza vaccine needed  - Flu vaccine HIGH DOSE PF (Fluzone High dose)       Lelon Huh, MD  [facility]

## 2017-08-08 LAB — CBC
HCT: 43.1 % (ref 38.5–50.0)
Hemoglobin: 15 g/dL (ref 13.2–17.1)
MCH: 31.1 pg (ref 27.0–33.0)
MCHC: 34.8 g/dL (ref 32.0–36.0)
MCV: 89.4 fL (ref 80.0–100.0)
MPV: 10.4 fL (ref 7.5–12.5)
PLATELETS: 154 10*3/uL (ref 140–400)
RBC: 4.82 10*6/uL (ref 4.20–5.80)
RDW: 12.9 % (ref 11.0–15.0)
WBC: 5.9 10*3/uL (ref 3.8–10.8)

## 2017-08-08 LAB — COMPLETE METABOLIC PANEL WITH GFR
AG RATIO: 1.6 (calc) (ref 1.0–2.5)
ALBUMIN MSPROF: 4.3 g/dL (ref 3.6–5.1)
ALKALINE PHOSPHATASE (APISO): 62 U/L (ref 40–115)
ALT: 20 U/L (ref 9–46)
AST: 30 U/L (ref 10–35)
BILIRUBIN TOTAL: 0.7 mg/dL (ref 0.2–1.2)
BUN: 19 mg/dL (ref 7–25)
CHLORIDE: 101 mmol/L (ref 98–110)
CO2: 33 mmol/L — ABNORMAL HIGH (ref 20–32)
CREATININE: 0.91 mg/dL (ref 0.70–1.11)
Calcium: 9.8 mg/dL (ref 8.6–10.3)
GFR, Est African American: 89 mL/min/{1.73_m2} (ref >=60)
GFR, Est Non African American: 77 mL/min/{1.73_m2} (ref >=60)
GLOBULIN: 2.7 g/dL (ref 1.9–3.7)
Glucose, Bld: 107 mg/dL — ABNORMAL HIGH (ref 65–99)
POTASSIUM: 4 mmol/L (ref 3.5–5.3)
SODIUM: 139 mmol/L (ref 135–146)
Total Protein: 7 g/dL (ref 6.1–8.1)

## 2017-08-08 LAB — LIPID PANEL
CHOL/HDL RATIO: 3.4 (calc) (ref <5.0)
CHOLESTEROL: 121 mg/dL (ref <200)
HDL: 36 mg/dL — ABNORMAL LOW (ref >40)
LDL Cholesterol (Calc): 64 mg/dL (calc)
Non-HDL Cholesterol (Calc): 85 mg/dL (calc) (ref <130)
Triglycerides: 130 mg/dL (ref <150)

## 2018-01-03 ENCOUNTER — Telehealth: Payer: Self-pay | Admitting: Family Medicine

## 2018-01-04 ENCOUNTER — Ambulatory Visit (INDEPENDENT_AMBULATORY_CARE_PROVIDER_SITE_OTHER): Payer: Medicare Other | Admitting: Physician Assistant

## 2018-01-04 ENCOUNTER — Encounter: Payer: Self-pay | Admitting: Physician Assistant

## 2018-01-04 VITALS — BP 140/80 | HR 71 | Temp 97.6°F | Resp 16 | Wt 167.2 lb

## 2018-01-04 DIAGNOSIS — H8111 Benign paroxysmal vertigo, right ear: Secondary | ICD-10-CM | POA: Diagnosis not present

## 2018-01-04 DIAGNOSIS — H6121 Impacted cerumen, right ear: Secondary | ICD-10-CM

## 2018-01-04 NOTE — Patient Instructions (Signed)

## 2018-01-04 NOTE — Progress Notes (Signed)
Patient: Benjamin Walls Male    DOB: Jun 07, 1930   84 y.o.   MRN: [id number] Visit Date: 01/04/2018  Today's Provider: Mar Daring, PA-C   Chief Complaint  Patient presents with  . Dizziness   Subjective:    Dizziness  This is a new problem. The current episode started in the past 7 days (for the last couple of days). The problem occurs every several days. The problem has been unchanged (Reports it comes and goes). Pertinent negatives include no abdominal pain, change in bowel habit, chest pain, congestion, coughing, fatigue, fever, headaches, numbness, rash, sore throat, urinary symptoms, visual change, vomiting or weakness. Associated symptoms comments: Feels dizzy when he turns his head, look down or up. . 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 He has tried nothing (Drinks tea or a glass of wine sometimes.) for the symptoms.      Allergies  Allergen Reactions  . Lotensin  [Benazepril Hcl]   . Penicillins      Current Outpatient Medications:  .  aspirin 81 MG tablet, Take 1 tablet by mouth daily., Disp: , Rfl:  .  losartan-hydrochlorothiazide (HYZAAR) 100-25 MG tablet, TAKE 1 TABLET BY MOUTH EVERY DAY, Disp: 90 tablet, Rfl: 3 .  lovastatin (MEVACOR) 20 MG tablet, TAKE 1 TABLET BY MOUTH EVERY DAY, Disp: 30 tablet, Rfl: 12  Review of Systems  Constitutional: Negative for fatigue and fever.  HENT: Negative for congestion and sore throat.   Respiratory: Negative for cough, chest tightness, shortness of breath and wheezing.   Cardiovascular: Negative for chest pain, palpitations and leg swelling.  Gastrointestinal: Negative for abdominal pain, change in bowel habit and vomiting.  Skin: Negative for rash.  Neurological: Positive for dizziness and light-headedness ("sometimes'). Negative for weakness, numbness and headaches.    Social History   Tobacco Use  . Smoking status: Former Smoker    Last  attempt to quit: 11/06/1962    Years since quitting: 55.2  . Smokeless tobacco: Never Used  Substance Use Topics  . Alcohol use: Yes    Alcohol/week: 0.0 oz    Comment: 2 glasses of wine 3 times weekly   Objective:   BP 140/80 (BP Location: Left Arm, Patient Position: Sitting, Cuff Size: Normal)   Pulse 71   Temp 97.6 F (36.4 C) (Oral)   Resp 16   Wt 167 lb 3.2 oz (75.8 kg)   SpO2 99%   BMI 23.32 kg/m    Physical Exam  Constitutional: He is oriented to person, place, and time. He appears well-developed and well-nourished. No distress.  HENT:  Head: Normocephalic and atraumatic.  Right Ear: Hearing, tympanic membrane, external ear and ear canal normal. Tympanic membrane is not erythematous and not bulging. No middle ear effusion.  Left Ear: Hearing, tympanic membrane, external ear and ear canal normal. Tympanic membrane is not erythematous and not bulging.  No middle ear effusion.  Nose: Mucosal edema and rhinorrhea present. Right sinus exhibits no maxillary sinus tenderness and no frontal sinus tenderness. Left sinus exhibits no maxillary sinus tenderness and no frontal sinus tenderness.  Mouth/Throat: Uvula is midline, oropharynx is clear and moist and mucous membranes are normal. No oropharyngeal exudate, posterior oropharyngeal edema or posterior oropharyngeal erythema.  Cerumen impaction was noted in right ear. Ear lavage was successful.  Eyes: Conjunctivae and EOM are normal. Pupils are equal, round, and reactive to light. Right eye exhibits no discharge. Left eye  exhibits no discharge.  Neck: Normal range of motion. Neck supple. No tracheal deviation present. No Brudzinski's sign and no Kernig's sign noted. No thyromegaly present.  Cardiovascular: Normal rate, regular rhythm and normal heart sounds. Exam reveals no gallop and no friction rub.  No murmur heard. Pulmonary/Chest: Effort normal and breath sounds normal. No stridor. No respiratory distress. He has no wheezes. He has  no rales.  Musculoskeletal: He exhibits no edema.  Lymphadenopathy:    He has no cervical adenopathy.  Neurological: He is alert and oriented to person, place, and time. He has normal strength. No cranial nerve deficit or sensory deficit. He exhibits normal muscle tone. He displays a negative Romberg sign. Coordination and gait normal.  Skin: Skin is warm and dry. He is not diaphoretic.  Vitals reviewed.      Assessment & Plan:     1. BPPV (benign paroxysmal positional vertigo), right Discussed Brandt-Daroff exercises and hand out given to patient. He is to call if symptoms do not improve.   2. Impacted cerumen of right ear Ear lavage successful. Patient noted immediate improvement in symptoms following clearance of wax. Advised to buy debrox ear drops and use once weekly to keep wax soft to hopefully prevent future ear wax build up.  - Ear Lavage       Mar Daring, PA-C  [facility]

## 2018-01-09 ENCOUNTER — Other Ambulatory Visit: Payer: Self-pay | Admitting: Family Medicine

## 2018-01-09 DIAGNOSIS — I10 Essential (primary) hypertension: Secondary | ICD-10-CM

## 2018-02-07 NOTE — Telephone Encounter (Signed)
complete

## 2018-06-11 ENCOUNTER — Other Ambulatory Visit: Payer: Self-pay | Admitting: Family Medicine

## 2018-06-11 MED ORDER — LOVASTATIN 20 MG PO TABS: 20.0000 mg | ORAL_TABLET | Freq: Every day | ORAL | 3 refills | Status: DC

## 2018-06-11 NOTE — Telephone Encounter (Signed)
[facility] [address] faxed refill request for the following medications:  lovastatin (MEVACOR) 20 MG tablet    Last Rx: 01/09/18 with 3 refills LOV: 01/04/18 Please advise. Thanks TNP

## 2018-06-21 ENCOUNTER — Telehealth: Payer: Self-pay | Admitting: Family Medicine

## 2018-06-21 NOTE — Telephone Encounter (Signed)
I left a message asking the pt to call me at [phone] to schedule AWV w/ [facility]. Last AWV 09/18/16. VDM (DD)

## 2018-07-09 ENCOUNTER — Ambulatory Visit: Payer: Medicare Other

## 2018-07-10 ENCOUNTER — Ambulatory Visit (INDEPENDENT_AMBULATORY_CARE_PROVIDER_SITE_OTHER): Payer: Medicare Other

## 2018-07-10 VITALS — BP 138/64 | HR 95 | Temp 98.3°F | Ht 71.0 in | Wt 164.4 lb

## 2018-07-10 DIAGNOSIS — Z23 Encounter for immunization: Secondary | ICD-10-CM | POA: Diagnosis not present

## 2018-07-10 DIAGNOSIS — Z0000 Encounter for general adult medical examination without abnormal findings: Secondary | ICD-10-CM

## 2018-07-10 NOTE — Patient Instructions (Addendum)
Benjamin Walls , Thank you for taking time to come for your Medicare Wellness Visit. I appreciate your ongoing commitment to your health goals. Please review the following plan we discussed and let me know if I can assist you in the future.   Screening recommendations/referrals: Colonoscopy: N/A Recommended yearly ophthalmology/optometry visit for glaucoma screening and checkup Recommended yearly dental visit for hygiene and checkup  Vaccinations: Influenza vaccine: Up to date Pneumococcal vaccine: Up to date Tdap vaccine: Up to date Shingles vaccine: Pt declines today.     Advanced directives: Please bring a copy of your POA (Power of Attorney) and/or Living Will to your next appointment.   Conditions/risks identified: Recommend increasing water intake to 4-6 glasses a day.   Next appointment: Pt declined scheduling his CPE at this time. Scheduled the AWV for 07/15/19.  Preventive Care 75 Years and Older, Male Preventive care refers to lifestyle choices and visits with your health care provider that can promote health and wellness. What does preventive care include?  A yearly physical exam. This is also called an annual well check.  Dental exams once or twice a year.  Routine eye exams. Ask your health care provider how often you should have your eyes checked.  Personal lifestyle choices, including:  Daily care of your teeth and gums.  Regular physical activity.  Eating a healthy diet.  Avoiding tobacco and drug use.  Limiting alcohol use.  Practicing safe sex.  Taking low doses of aspirin every day.  Taking vitamin and mineral supplements as recommended by your health care provider. What happens during an annual well check? The services and screenings done by your health care provider during your annual well check will depend on your age, overall health, lifestyle risk factors, and family history of disease. Counseling  Your health care provider may ask you questions  about your:  Alcohol use.  Tobacco use.  Drug use.  Emotional well-being.  Home and relationship well-being.  Sexual activity.  Eating habits.  History of falls.  Memory and ability to understand (cognition).  Work and work Statistician. Screening  You may have the following tests or measurements:  Height, weight, and BMI.  Blood pressure.  Lipid and cholesterol levels. These may be checked every 5 years, or more frequently if you are over 22 years old.  Skin check.  Lung cancer screening. You may have this screening every year starting at age 55 if you have a 30-pack-year history of smoking and currently smoke or have quit within the past 15 years.  Fecal occult blood test (FOBT) of the stool. You may have this test every year starting at age 33.  Flexible sigmoidoscopy or colonoscopy. You may have a sigmoidoscopy every 5 years or a colonoscopy every 10 years starting at age 40.  Prostate cancer screening. Recommendations will vary depending on your family history and other risks.  Hepatitis C blood test.  Hepatitis B blood test.  Sexually transmitted disease (STD) testing.  Diabetes screening. This is done by checking your blood sugar (glucose) after you have not eaten for a while (fasting). You may have this done every 1-3 years.  Abdominal aortic aneurysm (AAA) screening. You may need this if you are a current or former smoker.  Osteoporosis. You may be screened starting at age 21 if you are at high risk. Talk with your health care provider about your test results, treatment options, and if necessary, the need for more tests. Vaccines  Your health care provider may recommend certain  vaccines, such as:  Influenza vaccine. This is recommended every year.  Tetanus, diphtheria, and acellular pertussis (Tdap, Td) vaccine. You may need a Td booster every 10 years.  Zoster vaccine. You may need this after age 32.  Pneumococcal 13-valent conjugate (PCV13)  vaccine. One dose is recommended after age 54.  Pneumococcal polysaccharide (PPSV23) vaccine. One dose is recommended after age 17. Talk to your health care provider about which screenings and vaccines you need and how often you need them. This information is not intended to replace advice given to you by your health care provider. Make sure you discuss any questions you have with your health care provider. Document Released: 11/19/2015 Document Revised: 07/12/2016 Document Reviewed: 08/24/2015 Elsevier Interactive Patient Education  2017 [place] Prevention in the Home Falls can cause injuries. They can happen to people of all ages. There are many things you can do to make your home safe and to help prevent falls. What can I do on the outside of my home?  Regularly fix the edges of walkways and driveways and fix any cracks.  Remove anything that might make you trip as you walk through a door, such as a raised step or threshold.  Trim any bushes or trees on the path to your home.  Use bright outdoor lighting.  Clear any walking paths of anything that might make someone trip, such as rocks or tools.  Regularly check to see if handrails are loose or broken. Make sure that both sides of any steps have handrails.  Any raised decks and porches should have guardrails on the edges.  Have any leaves, snow, or ice cleared regularly.  Use sand or salt on walking paths during winter.  Clean up any spills in your garage right away. This includes oil or grease spills. What can I do in the bathroom?  Use night lights.  Install grab bars by the toilet and in the tub and shower. Do not use towel bars as grab bars.  Use non-skid mats or decals in the tub or shower.  If you need to sit down in the shower, use a plastic, non-slip stool.  Keep the floor dry. Clean up any water that spills on the floor as soon as it happens.  Remove soap buildup in the tub or shower  regularly.  Attach bath mats securely with double-sided non-slip rug tape.  Do not have throw rugs and other things on the floor that can make you trip. What can I do in the bedroom?  Use night lights.  Make sure that you have a light by your bed that is easy to reach.  Do not use any sheets or blankets that are too big for your bed. They should not hang down onto the floor.  Have a firm chair that has side arms. You can use this for support while you get dressed.  Do not have throw rugs and other things on the floor that can make you trip. What can I do in the kitchen?  Clean up any spills right away.  Avoid walking on wet floors.  Keep items that you use a lot in easy-to-reach places.  If you need to reach something above you, use a strong step stool that has a grab bar.  Keep electrical cords out of the way.  Do not use floor polish or wax that makes floors slippery. If you must use wax, use non-skid floor wax.  Do not have throw rugs and other things  on the floor that can make you trip. What can I do with my stairs?  Do not leave any items on the stairs.  Make sure that there are handrails on both sides of the stairs and use them. Fix handrails that are broken or loose. Make sure that handrails are as long as the stairways.  Check any carpeting to make sure that it is firmly attached to the stairs. Fix any carpet that is loose or worn.  Avoid having throw rugs at the top or bottom of the stairs. If you do have throw rugs, attach them to the floor with carpet tape.  Make sure that you have a light switch at the top of the stairs and the bottom of the stairs. If you do not have them, ask someone to add them for you. What else can I do to help prevent falls?  Wear shoes that:  Do not have high heels.  Have rubber bottoms.  Are comfortable and fit you well.  Are closed at the toe. Do not wear sandals.  If you use a stepladder:  Make sure that it is fully  opened. Do not climb a closed stepladder.  Make sure that both sides of the stepladder are locked into place.  Ask someone to hold it for you, if possible.  Clearly mark and make sure that you can see:  Any grab bars or handrails.  First and last steps.  Where the edge of each step is.  Use tools that help you move around (mobility aids) if they are needed. These include:  Canes.  Walkers.  Scooters.  Crutches.  Turn on the lights when you go into a dark area. Replace any light bulbs as soon as they burn out.  Set up your furniture so you have a clear path. Avoid moving your furniture around.  If any of your floors are uneven, fix them.  If there are any pets around you, be aware of where they are.  Review your medicines with your doctor. Some medicines can make you feel dizzy. This can increase your chance of falling. Ask your doctor what other things that you can do to help prevent falls. This information is not intended to replace advice given to you by your health care provider. Make sure you discuss any questions you have with your health care provider. Document Released: 08/19/2009 Document Revised: 03/30/2016 Document Reviewed: 11/27/2014 Elsevier Interactive Patient Education  2017 [employer].

## 2018-07-10 NOTE — Progress Notes (Signed)
Subjective:   Benjamin Walls is a 84 y.o. male who presents for Medicare Annual/Subsequent preventive examination.  Review of Systems:  N/A  Cardiac Risk Factors include: advanced age (>48men, >38 women);dyslipidemia;hypertension;male gender     Objective:    Vitals: BP 138/64 (BP Location: Right Arm)   Pulse 95   Temp 98.3 F (36.8 C) (Oral)   Ht 5\' 11"  (1.803 m)   Wt 164 lb 6.4 oz (74.6 kg)   BMI 22.93 kg/m   Body mass index is 22.93 kg/m.  Advanced Directives 07/10/2018  Does Patient Have a Medical Advance Directive? Yes  Type of Advance Directive Living will;Healthcare Power of [place] in Chart? No - copy requested    Tobacco Social History   Tobacco Use  Smoking Status Former Smoker  . Last attempt to quit: 11/06/1962  . Years since quitting: 55.7  Smokeless Tobacco Never Used     Counseling given: Not Answered   Clinical Intake:  Pre-visit preparation completed: Yes  Pain : No/denies pain Pain Score: 0-No pain     Nutritional Status: BMI of 19-24  Normal Nutritional Risks: None Diabetes: No  How often do you need to have someone help you when you read instructions, pamphlets, or other written materials from your doctor or pharmacy?: 1 - Never  Interpreter Needed?: No  Information entered by :: [facility], LPN  Past Medical History:  Diagnosis Date  . Hyperlipidemia   . Hypertension    Past Surgical History:  Procedure Laterality Date  . CATARACT EXTRACTION  10/09/2012 and 04/2013  . ERCP  07/27/2009   Impression: Prescence of pancreatic stent. This was found to be patent. -Cholendocholithiasis was found. Complete removal was accomplished by billiary sphincterotomy.- Dilated CBD with single large stone, which was extracted with ballone.   . Exercise Stress test  05/06/1999  . PTCA  06/1999   STENT  . TONSILLECTOMY AND ADENOIDECTOMY     Family History  Problem Relation Age of Onset  . Hypertension  Mother   . Heart Problems Father    Social History   Socioeconomic History  . Marital status: Married    Spouse name: Not on file  . Number of children: 2  . Years of education: Not on file  . Highest education level: Bachelor's degree (e.g., BA, AB, BS)  Occupational History  . 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 Financial resource strain: Not hard at all  . Food insecurity:    Worry: Never true    Inability: Never true  . Transportation needs:    Medical: No    Non-medical: No  Tobacco Use  . Smoking status: Former Smoker    Last attempt to quit: 11/06/1962    Years since quitting: 55.7  . Smokeless tobacco: Never Used  Substance and Sexual Activity  . Alcohol use: Yes    Alcohol/week: 2.0 - 3.0 standard drinks    Types: 2 - 3 Glasses of wine per week  . Drug use: No  . Sexual activity: Not on file  Lifestyle  . Physical activity:    Days per week: Not on file    Minutes per session: Not on file  . Stress: To some extent  Relationships  . Social connections:    Talks on phone: Not on file    Gets together: Not on file    Attends religious service: Not  on file    Active member of club or organization: Not on file    Attends meetings of clubs or organizations: Not on file    Relationship status: Not on file  Other Topics Concern  . Not on file  Social History Narrative  . Not on file    Outpatient Encounter Medications as of 07/10/2018  Medication Sig  . aspirin 81 MG tablet Take 1 tablet by mouth daily.  Marland Kitchen losartan-hydrochlorothiazide (HYZAAR) 100-25 MG tablet TAKE 1 TABLET BY MOUTH EVERY DAY  . lovastatin (MEVACOR) 20 MG tablet Take 1 tablet (20 mg total) by mouth daily.   No facility-administered encounter medications on file as of 07/10/2018.     Activities of Daily Living In your present state of health, do you have any difficulty performing the following activities: 07/10/2018    Hearing? N  Vision? N  Difficulty concentrating or making decisions? N  Walking or climbing stairs? N  Dressing or bathing? N  Doing errands, shopping? N  Preparing Food and eating ? N  Using the Toilet? N  In the past six months, have you accidently leaked urine? N  Do you have problems with loss of bowel control? N  Managing your Medications? N  Managing your Finances? N  Housekeeping or managing your Housekeeping? N  Some recent data might be hidden    Patient Care Team: Birdie Sons, MD as PCP - General (Family Medicine)   Assessment:   This is a routine wellness examination for [facility].  Exercise Activities and Dietary recommendations Current Exercise Habits: Home exercise routine, Type of exercise: walking(also walks 3 miles 3 days a week), Time (Minutes): 20, Frequency (Times/Week): 7, Weekly Exercise (Minutes/Week): 140, Intensity: Mild, Exercise limited by: None identified  Goals    . DIET - INCREASE WATER INTAKE     Recommend increasing water intake to 4-6 glasses a day.        Fall Risk Fall Risk  07/10/2018 09/18/2016 09/15/2015  Falls in the past year? No No No   Is the patient's home free of loose throw rugs in walkways, pet beds, electrical cords, etc?   yes      Grab bars in the bathroom? yes      Handrails on the stairs?   yes      Adequate lighting?   yes  Timed Get Up and Go Performed: N/A  Depression Screen PHQ 2/9 Scores 07/10/2018 09/18/2016 09/15/2015  PHQ - 2 Score 0 0 0    Cognitive Function     6CIT Screen 07/10/2018  What Year? 0 points  What month? 0 points  What time? 0 points  Count back from 20 0 points  Months in reverse 0 points  Repeat phrase 2 points  Total Score 2    Immunization History  Administered Date(s) Administered  . Influenza Split 09/05/2011, 09/06/2012  . Influenza, High Dose Seasonal PF 09/11/2014, 09/18/2016, 08/07/2017, 07/10/2018  . Influenza,inj,Quad PF,6+ Mos 09/09/2013  . Pneumococcal Conjugate-13  09/11/2014  . Pneumococcal Polysaccharide-23 09/02/2008  . Tdap 09/05/2011  . Zoster 08/30/2010    Qualifies for Shingles Vaccine? Due for Shingles vaccine. Declined my offer to administer today. Education has been provided regarding the importance of this vaccine. Pt has been advised to call her insurance company to determine her out of pocket expense. Advised she may also receive this vaccine at her local pharmacy or Health Dept. Verbalized acceptance and understanding.  Screening Tests Health Maintenance  Topic Date Due  . TETANUS/TDAP  09/04/2021  . INFLUENZA VACCINE  Completed  . PNA vac Low Risk Adult  Completed   Cancer Screenings: Lung: Low Dose CT Chest recommended if Age 46-80 years, 30 pack-year currently smoking OR have quit w/in 15years. Patient does not qualify. Colorectal: N/A  Additional Screenings:  Hepatitis C Screening: N/A      Plan:  I have personally reviewed and addressed the Medicare Annual Wellness questionnaire and have noted the following in the patient's chart:  A. Medical and social history B. Use of alcohol, tobacco or illicit drugs  C. Current medications and supplements D. Functional ability and status E.  Nutritional status F.  Physical activity G. Advance directives H. List of other physicians I.  Hospitalizations, surgeries, and ER visits in previous 12 months J.  [place] such as hearing and vision if needed, cognitive and depression L. Referrals and appointments - none  In addition, I have reviewed and discussed with patient certain preventive protocols, quality metrics, and best practice recommendations. A written personalized care plan for preventive services as well as general preventive health recommendations were provided to patient.  See attached scanned questionnaire for additional information.   Signed,  Fabio Neighbors, LPN Nurse Health Advisor   Nurse Recommendations: None.

## 2018-07-31 ENCOUNTER — Ambulatory Visit (INDEPENDENT_AMBULATORY_CARE_PROVIDER_SITE_OTHER): Payer: Medicare Other | Admitting: Family Medicine

## 2018-07-31 ENCOUNTER — Encounter: Payer: Self-pay | Admitting: Family Medicine

## 2018-07-31 VITALS — BP 182/86 | HR 62 | Temp 97.7°F | Resp 16 | Wt 160.0 lb

## 2018-07-31 DIAGNOSIS — D692 Other nonthrombocytopenic purpura: Secondary | ICD-10-CM | POA: Insufficient documentation

## 2018-07-31 DIAGNOSIS — E785 Hyperlipidemia, unspecified: Secondary | ICD-10-CM

## 2018-07-31 DIAGNOSIS — I2510 Atherosclerotic heart disease of native coronary artery without angina pectoris: Secondary | ICD-10-CM | POA: Diagnosis not present

## 2018-07-31 DIAGNOSIS — I10 Essential (primary) hypertension: Secondary | ICD-10-CM | POA: Diagnosis not present

## 2018-07-31 MED ORDER — LOSARTAN POTASSIUM-HCTZ 100-25 MG PO TABS: 1.0000 | ORAL_TABLET | Freq: Every day | ORAL | 4 refills | Status: DC

## 2018-07-31 NOTE — Progress Notes (Signed)
Patient: Benjamin Walls Male    DOB: [birth year]   84 y.o.   MRN: [id number] Visit Date: 07/31/2018  Today's Provider: Lelon Huh, MD   Chief Complaint  Patient presents with  . Hypertension  . Hyperlipidemia   Subjective:   Patient saw [facility] for AWV on 07/10/2018.  HPI   Hypertension, follow-up:  BP Readings from Last 3 Encounters:  07/31/18 (!) 182/86  07/10/18 138/64  01/04/18 140/80    He was last seen for hypertension 11 months ago.  BP at that visit was 138/68. Management since that visit includes; no changes.He reports poor compliance with treatment. Patient stopped taking Losartan / HCTZ 6 days ago due to a news report on the radio stating it contained carcinogenic agents. He is not having side effects.  He is exercising. He is adherent to low salt diet.   Outside blood pressures are checked and at the pharmacy. He is experiencing none.  Patient denies chest pain, chest pressure/discomfort, claudication, dyspnea, exertional chest pressure/discomfort, fatigue, irregular heart beat, lower extremity edema, near-syncope, orthopnea, palpitations, paroxysmal nocturnal dyspnea, syncope and tachypnea.   Cardiovascular risk factors include advanced age (older than 2 for men, 74 for women), dyslipidemia, hypertension and male gender.  Use of agents associated with hypertension: NSAIDS.   ---------------------------------------------------------------    Lipid/Cholesterol, Follow-up:   Last seen for this 11 months ago.  Management since that visit includes; labs checked, no changes.  Last Lipid Panel:    Component Value Date/Time   CHOL 121 08/07/2017 0846   CHOL 136 09/18/2016 1117   TRIG 130 08/07/2017 0846   HDL 36 (L) 08/07/2017 0846   HDL 38 (L) 09/18/2016 1117   CHOLHDL 3.4 08/07/2017 0846   LDLCALC 64 08/07/2017 0846    He reports poor compliance with treatment.   He is not having side effects.   Wt Readings from Last 3 Encounters:    07/31/18 160 lb (72.6 kg)  07/10/18 164 lb 6.4 oz (74.6 kg)  01/04/18 167 lb 3.2 oz (75.8 kg)    ---------------------------------------------------------------  He has history of CAD s/t remote cardiac stent placement. Denies any recent chest pains, heart flutters, or dyspnea.    He also complains of easy bruising on his arms with minor bumps. Not painful. No abnormal bleeding. No blood in stool or tarry stools.   Otherwise he is dong well. Still works setting up computers. Walks for exercise regularly.    Allergies  Allergen Reactions  . Lotensin  [Benazepril Hcl]   . Penicillins      Current Outpatient Medications:  .  aspirin 81 MG tablet, Take 1 tablet by mouth daily., Disp: , Rfl:  .  losartan-hydrochlorothiazide (HYZAAR) 100-25 MG tablet, TAKE 1 TABLET BY MOUTH EVERY DAY, Disp: 90 tablet, Rfl: 3 .  lovastatin (MEVACOR) 20 MG tablet, Take 1 tablet (20 mg total) by mouth daily., Disp: 30 tablet, Rfl: 3  Review of Systems  Constitutional: Negative for appetite change, chills and fever.  Respiratory: Negative for chest tightness, shortness of breath and wheezing.   Cardiovascular: Negative for chest pain and palpitations.  Gastrointestinal: Negative for abdominal pain, nausea and vomiting.    Social History   Tobacco Use  . Smoking status: Former Smoker    Last attempt to quit: 11/06/1962    Years since quitting: 55.7  . Smokeless tobacco: Never Used  Substance Use Topics  . Alcohol use: Yes    Alcohol/week: 2.0 - 3.0 standard drinks  Types: 2 - 3 Glasses of wine per week   Objective:   BP (!) 182/86 (BP Location: Right Arm, Cuff Size: Normal)   Pulse 62   Temp 97.7 F (36.5 C) (Oral)   Resp 16   Wt 160 lb (72.6 kg)   SpO2 99% Comment: room air  BMI 22.32 kg/m  Vitals:   07/31/18 0831 07/31/18 0834  BP: (!) 170/8 (!) 182/86  Pulse: 62   Resp: 16   Temp: 97.7 F (36.5 C)   TempSrc: Oral   SpO2: 99%   Weight: 160 lb (72.6 kg)      Physical  Exam   General Appearance:    Alert, cooperative, no distress  Eyes:    PERRL, conjunctiva/corneas clear, EOM's intact       Lungs:     Clear to auscultation bilaterally, respirations unlabored  Heart:    Regular rate and rhythm  Skin:   Scattered senile purpora both upper extremities.           Assessment & Plan:     1. Essential hypertension Was well controlled prior to stopping - losartan-hydrochlorothiazide (HYZAAR) 100-25 MG tablet; Take 1 tablet by mouth daily.  Dispense: 90 tablet; Refill: 4 Counseled on very low cancer risk of medication and that contaminated lots should not be available any more. Is to start back on medication.   2. Hyperlipidemia, unspecified hyperlipidemia type He is tolerating lovastatin well with no adverse effects.   - CBC - Comprehensive metabolic panel - Lipid panel  3. Senile purpura ([place]) Reassurance.   4. Coronary artery disease involving native coronary artery of native heart without angina pectoris Asymptomatic. Compliant with medication.  Continue aggressive risk factor modification.         Lelon Huh, MD  [facility]

## 2018-08-01 LAB — LIPID PANEL
Chol/HDL Ratio: 3.4 ratio (ref 0.0–5.0)
Cholesterol, Total: 124 mg/dL (ref 100–199)
HDL: 37 mg/dL — AB (ref >39)
LDL Calculated: 66 mg/dL (ref 0–99)
TRIGLYCERIDES: 106 mg/dL (ref 0–149)
VLDL Cholesterol Cal: 21 mg/dL (ref 5–40)

## 2018-08-01 LAB — CBC
Hematocrit: 42.1 % (ref 37.5–51.0)
Hemoglobin: 14.4 g/dL (ref 13.0–17.7)
MCH: 31.4 pg (ref 26.6–33.0)
MCHC: 34.2 g/dL (ref 31.5–35.7)
MCV: 92 fL (ref 79–97)
PLATELETS: 149 10*3/uL — AB (ref 150–450)
RBC: 4.58 x10E6/uL (ref 4.14–5.80)
RDW: 13.4 % (ref 12.3–15.4)
WBC: 5.8 10*3/uL (ref 3.4–10.8)

## 2018-08-01 LAB — COMPREHENSIVE METABOLIC PANEL
ALT: 22 IU/L (ref 0–44)
AST: 34 IU/L (ref 0–40)
Albumin/Globulin Ratio: 1.6 (ref 1.2–2.2)
Albumin: 4.2 g/dL (ref 3.5–4.7)
Alkaline Phosphatase: 73 IU/L (ref 39–117)
BILIRUBIN TOTAL: 0.8 mg/dL (ref 0.0–1.2)
BUN/Creatinine Ratio: 17 (ref 10–24)
BUN: 16 mg/dL (ref 8–27)
CHLORIDE: 98 mmol/L (ref 96–106)
CO2: 27 mmol/L (ref 20–29)
Calcium: 9.5 mg/dL (ref 8.6–10.2)
Creatinine, Ser: 0.92 mg/dL (ref 0.76–1.27)
GFR calc non Af Amer: 75 mL/min/{1.73_m2} (ref >59)
GFR, EST AFRICAN AMERICAN: 87 mL/min/{1.73_m2} (ref >59)
GLUCOSE: 90 mg/dL (ref 65–99)
Globulin, Total: 2.6 g/dL (ref 1.5–4.5)
Potassium: 3.9 mmol/L (ref 3.5–5.2)
Sodium: 138 mmol/L (ref 134–144)
TOTAL PROTEIN: 6.8 g/dL (ref 6.0–8.5)

## 2018-08-22 ENCOUNTER — Other Ambulatory Visit: Payer: Self-pay | Admitting: Family Medicine

## 2019-05-16 ENCOUNTER — Ambulatory Visit (INDEPENDENT_AMBULATORY_CARE_PROVIDER_SITE_OTHER): Payer: Medicare Other | Admitting: Physician Assistant

## 2019-05-16 ENCOUNTER — Other Ambulatory Visit: Payer: Self-pay

## 2019-05-16 ENCOUNTER — Encounter: Payer: Self-pay | Admitting: Physician Assistant

## 2019-05-16 VITALS — BP 147/74 | HR 78 | Temp 98.4°F | Resp 16 | Wt 161.8 lb

## 2019-05-16 DIAGNOSIS — L249 Irritant contact dermatitis, unspecified cause: Secondary | ICD-10-CM | POA: Diagnosis not present

## 2019-05-16 MED ORDER — TRIAMCINOLONE ACETONIDE 0.1 % EX CREA: 1.0000 "application " | TOPICAL_CREAM | Freq: Two times a day (BID) | CUTANEOUS | 0 refills | Status: DC

## 2019-05-16 NOTE — Patient Instructions (Signed)
Rash, Adult  A rash is a change in the color of your skin. A rash can also change the way your skin feels. There are many different conditions and factors that can cause a rash. Follow these instructions at home: The goal of treatment is to stop the itching and keep the rash from spreading. Watch for any changes in your symptoms. Let your doctor know about them. Follow these instructions to help with your condition: Medicine Take or apply over-the-counter and prescription medicines only as told by your doctor. These may include medicines:  To treat red or swollen skin (corticosteroid creams).  To treat itching.  To treat an allergy (oral antihistamines).  To treat very bad symptoms (oral corticosteroids).  Skin care  Put cool cloths (compresses) on the affected areas.  Do not scratch or rub your skin.  Avoid covering the rash. Make sure that the rash is exposed to air as much as possible. Managing itching and discomfort  Avoid hot showers or baths. These can make itching worse. A cold shower may help.  Try taking a bath with: ? Epsom salts. You can get these at your local pharmacy or grocery store. Follow the instructions on the package. ? Baking soda. Pour a small amount into the bath as told by your doctor. ? Colloidal oatmeal. You can get this at your local pharmacy or grocery store. Follow the instructions on the package.  Try putting baking soda paste onto your skin. Stir water into baking soda until it gets like a paste.  Try putting on a lotion that relieves itchiness (calamine lotion).  Keep cool and out of the sun. Sweating and being hot can make itching worse. General instructions   Rest as needed.  Drink enough fluid to keep your pee (urine) pale yellow.  Wear loose-fitting clothing.  Avoid scented soaps, detergents, and perfumes. Use gentle soaps, detergents, perfumes, and other cosmetic products.  Avoid anything that causes your rash. Keep a journal to  help track what causes your rash. Write down: ? What you eat. ? What cosmetic products you use. ? What you drink. ? What you wear. This includes jewelry.  Keep all follow-up visits as told by your doctor. This is important. Contact a doctor if:  You sweat at night.  You lose weight.  You pee (urinate) more than normal.  You pee less than normal, or you notice that your pee is a darker color than normal.  You feel weak.  You throw up (vomit).  Your skin or the whites of your eyes look yellow (jaundice).  Your skin: ? Tingles. ? Is numb.  Your rash: ? Does not go away after a few days. ? Gets worse.  You are: ? More thirsty than normal. ? More tired than normal.  You have: ? New symptoms. ? Pain in your belly (abdomen). ? A fever. ? Watery poop (diarrhea). Get help right away if:  You have a fever and your symptoms suddenly get worse.  You start to feel mixed up (confused).  You have a very bad headache or a stiff neck.  You have very bad joint pains or stiffness.  You have jerky movements that you cannot control (seizure).  Your rash covers all or most of your body. The rash may or may not be painful.  You have blisters that: ? Are on top of the rash. ? Grow larger. ? Grow together. ? Are painful. ? Are inside your nose or mouth.  You have a rash   that: ? Looks like purple pinprick-sized spots all over your body. ? Has a "bull's eye" or looks like a target. ? Is red and painful, causes your skin to peel, and is not from being in the sun too long. Summary  A rash is a change in the color of your skin. A rash can also change the way your skin feels.  The goal of treatment is to stop the itching and keep the rash from spreading.  Take or apply over-the-counter and prescription medicines only as told by your doctor.  Contact a doctor if you have new symptoms or symptoms that get worse.  Keep all follow-up visits as told by your doctor. This is  important. This information is not intended to replace advice given to you by your health care provider. Make sure you discuss any questions you have with your health care provider. Document Released: 04/10/2008 Document Revised: 02/14/2019 Document Reviewed: 05/27/2018 Elsevier Patient Education  2020 Elsevier Inc.  

## 2019-05-16 NOTE — Progress Notes (Signed)
       Patient: Benjamin Walls Male    DOB: 11-May-1930   85 y.o.   MRN: [id number] Visit Date: 05/16/2019  Today's Provider: Trinna Post, PA-C   Chief Complaint  Patient presents with  . Rash   Subjective:     HPI Patient here today c/o rash on his back, arms, and legs for about 2 days. Patient reports rash is very itchy. Patient has been out working in the garden. He reports the rash is red and itchy. He has used some calamine lotion with some relief.   Allergies  Allergen Reactions  . Lotensin  [Benazepril Hcl]   . Penicillins      Current Outpatient Medications:  .  aspirin 81 MG tablet, Take 1 tablet by mouth daily., Disp: , Rfl:  .  losartan-hydrochlorothiazide (HYZAAR) 100-25 MG tablet, Take 1 tablet by mouth daily. (Patient not taking: Reported on 05/16/2019), Disp: 90 tablet, Rfl: 4 .  lovastatin (MEVACOR) 20 MG tablet, TAKE 1 TABLET(20 MG) BY MOUTH DAILY (Patient not taking: Reported on 05/16/2019), Disp: 30 tablet, Rfl: 12  Review of Systems  Skin: Positive for rash.    Social History   Tobacco Use  . Smoking status: Former Smoker    Quit date: 11/06/1962    Years since quitting: 56.5  . Smokeless tobacco: Never Used  Substance Use Topics  . Alcohol use: Yes    Alcohol/week: 2.0 - 3.0 standard drinks    Types: 2 - 3 Glasses of wine per week      Objective:   BP (!) 147/74 (BP Location: Left Arm, Patient Position: Sitting, Cuff Size: Normal)   Pulse 78   Temp 98.4 F (36.9 C) (Oral)   Resp 16   Wt 161 lb 12.8 oz (73.4 kg)   BMI 22.57 kg/m  Vitals:   05/16/19 1354  BP: (!) 147/74  Pulse: 78  Resp: 16  Temp: 98.4 F (36.9 C)  TempSrc: Oral  Weight: 161 lb 12.8 oz (73.4 kg)     Physical Exam Constitutional:      Appearance: Normal appearance.  Skin:    General: Skin is warm and dry.     Findings: Rash present.          Comments: Multiple discrete raised erythematous lesions on back overlying scapula.   Neurological:     Mental  Status: He is alert.  Psychiatric:        Mood and Affect: Mood normal.        Behavior: Behavior normal.      No results found for any visits on 05/16/19.     Assessment & Plan    1. Irritant dermatitis  Will send in some triamcinolone cream 0.1% to place twice daily onto affected area.  The entirety of the information documented in the History of Present Illness, Review of Systems and Physical Exam were personally obtained by me. Portions of this information were initially documented by Lynford Humphrey, CMA and reviewed by me for thoroughness and accuracy.   F/u PRN      Trinna Post, PA-C  [facility]

## 2019-05-18 ENCOUNTER — Other Ambulatory Visit: Payer: Self-pay | Admitting: Physician Assistant

## 2019-05-18 DIAGNOSIS — L249 Irritant contact dermatitis, unspecified cause: Secondary | ICD-10-CM

## 2019-06-29 ENCOUNTER — Other Ambulatory Visit: Payer: Self-pay | Admitting: Family Medicine

## 2019-07-10 NOTE — Progress Notes (Signed)
Subjective:   Benjamin Walls is a 85 y.o. male who presents for Medicare Annual/Subsequent preventive examination.    This visit is being conducted through telemedicine due to the COVID-19 pandemic. This patient has given me verbal consent via doximity to conduct this visit, patient states they are participating from their home address. Some vital signs may be absent or patient reported.    Patient identification: identified by name, DOB, and current address  Review of Systems:  N/A  Cardiac Risk Factors include: advanced age (>83men, >33 women);dyslipidemia;hypertension;male gender     Objective:    Vitals: There were no vitals taken for this visit.  There is no height or weight on file to calculate BMI. Unable to obtain vitals due to visit being conducted via telephonically.   Advanced Directives 07/15/2019 07/10/2018  Does Patient Have a Medical Advance Directive? No;Yes Yes  Type of Paramedic of [place];Living will Living will;Healthcare Power of [place] in Chart? No - copy requested No - copy requested    Tobacco Social History   Tobacco Use  Smoking Status Former Smoker  . Quit date: 11/06/1962  . Years since quitting: 56.7  Smokeless Tobacco Never Used     Counseling given: Not Answered   Clinical Intake:  Pre-visit preparation completed: Yes  Pain : No/denies pain Pain Score: 0-No pain     Nutritional Risks: None Diabetes: No  How often do you need to have someone help you when you read instructions, pamphlets, or other written materials from your doctor or pharmacy?: 1 - Never  Interpreter Needed?: No  Information entered by :: [facility], LPN  Past Medical History:  Diagnosis Date  . Hyperlipidemia   . Hypertension    Past Surgical History:  Procedure Laterality Date  . CATARACT EXTRACTION  10/09/2012 and 04/2013  . ERCP  07/27/2009   Impression: Prescence of pancreatic stent. This  was found to be patent. -Cholendocholithiasis was found. Complete removal was accomplished by billiary sphincterotomy.- Dilated CBD with single large stone, which was extracted with ballone.   . Exercise Stress test  05/06/1999  . PTCA  06/1999   STENT  . TONSILLECTOMY AND ADENOIDECTOMY     Family History  Problem Relation Age of Onset  . Hypertension Mother   . Heart Problems Father    Social History   Socioeconomic History  . Marital status: Married    Spouse name: Not on file  . Number of children: 2  . Years of education: Not on file  . Highest education level: Bachelor's degree (e.g., BA, AB, BS)  Occupational History  . Occupation: Tour manager in schools in [place]: Works at [employer]: not cuurently working due to [employer]  . Financial resource strain: Not hard at all  . Food insecurity    Worry: Never true    Inability: Never true  . Transportation needs    Medical: No    Non-medical: No  Tobacco Use  . Smoking status: Former Smoker    Quit date: 11/06/1962    Years since quitting: 56.7  . Smokeless tobacco: Never Used  Substance and Sexual Activity  . Alcohol use: Yes    Alcohol/week: 2.0 - 3.0 standard drinks    Types: 2 - 3 Glasses of wine per week  . Drug use: No  . Sexual activity: Not on file  Lifestyle  . Physical activity  Days per week: 0 days    Minutes per session: 0 min  . Stress: Not at all  Relationships  . Social Herbalist on phone: Patient refused    Gets together: Patient refused    Attends religious service: Patient refused    Active member of club or organization: Patient refused    Attends meetings of clubs or organizations: Patient refused    Relationship status: Patient refused  Other Topics Concern  . Not on file  Social History Narrative  . Not on file    Outpatient Encounter Medications as of 07/15/2019  Medication Sig  . aspirin 81 MG tablet Take 1 tablet by  mouth daily.  Marland Kitchen losartan-hydrochlorothiazide (HYZAAR) 100-25 MG tablet Take 1 tablet by mouth daily.  Marland Kitchen lovastatin (MEVACOR) 20 MG tablet TAKE 1 TABLET(20 MG) BY MOUTH DAILY  . triamcinolone cream (KENALOG) 0.1 % APPLY EXTERNALLY TO THE AFFECTED AREA TWICE DAILY   No facility-administered encounter medications on file as of 07/15/2019.     Activities of Daily Living In your present state of health, do you have any difficulty performing the following activities: 07/15/2019  Hearing? N  Vision? N  Difficulty concentrating or making decisions? N  Walking or climbing stairs? N  Dressing or bathing? N  Doing errands, shopping? N  Preparing Food and eating ? N  Using the Toilet? N  In the past six months, have you accidently leaked urine? N  Do you have problems with loss of bowel control? N  Managing your Medications? N  Managing your Finances? N  Housekeeping or managing your Housekeeping? N  Some recent data might be hidden    Patient Care Team: Birdie Sons, MD as PCP - General (Family Medicine)   Assessment:   This is a routine wellness examination for [facility].  Exercise Activities and Dietary recommendations Current Exercise Habits: Home exercise routine, Type of exercise: walking, Time (Minutes): 55, Frequency (Times/Week): 7, Weekly Exercise (Minutes/Week): 385, Intensity: Mild, Exercise limited by: None identified  Goals    . DIET - INCREASE WATER INTAKE     Recommend increasing water intake to 4-6 glasses a day.        Fall Risk: Fall Risk  07/15/2019 07/10/2018 09/18/2016 09/15/2015  Falls in the past year? 0 No No No    FALL RISK PREVENTION PERTAINING TO THE HOME:  Any stairs in or around the home? Yes  If so, are there any without handrails? No   Home free of loose throw rugs in walkways, pet beds, electrical cords, etc? Yes  Adequate lighting in your home to reduce risk of falls? Yes   ASSISTIVE DEVICES UTILIZED TO PREVENT FALLS:  Life alert? No  Use of a  cane, walker or w/c? No  Grab bars in the bathroom? Yes  Shower chair or bench in shower? Yes  Elevated toilet seat or a handicapped toilet? No   TIMED UP AND GO:  Was the test performed? No .    Depression Screen PHQ 2/9 Scores 07/15/2019 07/10/2018 09/18/2016 09/15/2015  PHQ - 2 Score 0 0 0 0    Cognitive Function     6CIT Screen 07/15/2019 07/10/2018  What Year? 0 points 0 points  What month? 0 points 0 points  What time? 0 points 0 points  Count back from 20 0 points 0 points  Months in reverse 0 points 0 points  Repeat phrase 0 points 2 points  Total Score 0 2    Immunization  History  Administered Date(s) Administered  . Influenza Split 09/05/2011, 09/06/2012  . Influenza, High Dose Seasonal PF 09/11/2014, 09/18/2016, 08/07/2017, 07/10/2018  . Influenza,inj,Quad PF,6+ Mos 09/09/2013  . Pneumococcal Conjugate-13 09/11/2014  . Pneumococcal Polysaccharide-23 09/02/2008  . Tdap 09/05/2011  . Zoster 08/30/2010    Qualifies for Shingles Vaccine? Yes  Zostavax completed 08/30/10. Due for Shingrix. Education has been provided regarding the importance of this vaccine. Pt has been advised to call insurance company to determine out of pocket expense. Advised may also receive vaccine at local pharmacy or Health Dept. Verbalized acceptance and understanding.  Tdap: Up to date  Flu Vaccine: Due for Flu vaccine. Does the patient want to receive this vaccine today?  No .   Pneumococcal Vaccine: Completed series  Screening Tests Health Maintenance  Topic Date Due  . INFLUENZA VACCINE  06/07/2019  . TETANUS/TDAP  09/04/2021  . PNA vac Low Risk Adult  Completed   Cancer Screenings:  Colorectal Screening: No longer required.   Lung Cancer Screening: (Low Dose CT Chest recommended if Age 80-80 years, 30 pack-year currently smoking OR have quit w/in 15years.) does not qualify.   Additional Screening:  Vision Screening: Recommended annual ophthalmology exams for early detection of  glaucoma and other disorders of the eye.  Dental Screening: Recommended annual dental exams for proper oral hygiene  Community Resource Referral:  CRR required this visit?  No        Plan:  I have personally reviewed and addressed the Medicare Annual Wellness questionnaire and have noted the following in the patient's chart:  A. Medical and social history B. Use of alcohol, tobacco or illicit drugs  C. Current medications and supplements D. Functional ability and status E.  Nutritional status F.  Physical activity G. Advance directives H. List of other physicians I.  Hospitalizations, surgeries, and ER visits in previous 12 months J.  [place] such as hearing and vision if needed, cognitive and depression L. Referrals and appointments   In addition, I have reviewed and discussed with patient certain preventive protocols, quality metrics, and best practice recommendations. A written personalized care plan for preventive services as well as general preventive health recommendations were provided to patient.   Glendora Score, [place]  [id number] Nurse Health Advisor   Nurse Notes: Influenza vaccine due at next in office visit.

## 2019-07-15 ENCOUNTER — Ambulatory Visit (INDEPENDENT_AMBULATORY_CARE_PROVIDER_SITE_OTHER): Payer: Medicare Other

## 2019-07-15 ENCOUNTER — Other Ambulatory Visit: Payer: Self-pay

## 2019-07-15 DIAGNOSIS — Z0000 Encounter for general adult medical examination without abnormal findings: Secondary | ICD-10-CM

## 2019-07-15 NOTE — Patient Instructions (Signed)
[name] , Thank you for taking time to come for your Medicare Wellness Visit. I appreciate your ongoing commitment to your health goals. Please review the following plan we discussed and let me know if I can assist you in the future.   Screening recommendations/referrals: Colonoscopy: No longer required.  Recommended yearly ophthalmology/optometry visit for glaucoma screening and checkup Recommended yearly dental visit for hygiene and checkup  Vaccinations: Influenza vaccine: Currently due  Pneumococcal vaccine: Completed series Tdap vaccine: Up to date, due 08/2021 Shingles vaccine: Pt declines today.     Advanced directives: Please bring a copy of your POA (Power of Attorney) and/or Living Will to your next appointment.   Conditions/risks identified: Continue to increase water intake to 6-8 8 oz glasses a day.   Next appointment: 07/23/19 @ 8:20 with Dr Caryn Section.   Preventive Care 83 Years and Older, Male Preventive care refers to lifestyle choices and visits with your health care provider that can promote health and wellness. What does preventive care include?  A yearly physical exam. This is also called an annual well check.  Dental exams once or twice a year.  Routine eye exams. Ask your health care provider how often you should have your eyes checked.  Personal lifestyle choices, including:  Daily care of your teeth and gums.  Regular physical activity.  Eating a healthy diet.  Avoiding tobacco and drug use.  Limiting alcohol use.  Practicing safe sex.  Taking low doses of aspirin every day.  Taking vitamin and mineral supplements as recommended by your health care provider. What happens during an annual well check? The services and screenings done by your health care provider during your annual well check will depend on your age, overall health, lifestyle risk factors, and family history of disease. Counseling  Your health care provider may ask you questions  about your:  Alcohol use.  Tobacco use.  Drug use.  Emotional well-being.  Home and relationship well-being.  Sexual activity.  Eating habits.  History of falls.  Memory and ability to understand (cognition).  Work and work Statistician. Screening  You may have the following tests or measurements:  Height, weight, and BMI.  Blood pressure.  Lipid and cholesterol levels. These may be checked every 5 years, or more frequently if you are over 64 years old.  Skin check.  Lung cancer screening. You may have this screening every year starting at age 30 if you have a 30-pack-year history of smoking and currently smoke or have quit within the past 15 years.  Fecal occult blood test (FOBT) of the stool. You may have this test every year starting at age 65.  Flexible sigmoidoscopy or colonoscopy. You may have a sigmoidoscopy every 5 years or a colonoscopy every 10 years starting at age 20.  Prostate cancer screening. Recommendations will vary depending on your family history and other risks.  Hepatitis C blood test.  Hepatitis B blood test.  Sexually transmitted disease (STD) testing.  Diabetes screening. This is done by checking your blood sugar (glucose) after you have not eaten for a while (fasting). You may have this done every 1-3 years.  Abdominal aortic aneurysm (AAA) screening. You may need this if you are a current or former smoker.  Osteoporosis. You may be screened starting at age 91 if you are at high risk. Talk with your health care provider about your test results, treatment options, and if necessary, the need for more tests. Vaccines  Your health care provider may recommend  certain vaccines, such as:  Influenza vaccine. This is recommended every year.  Tetanus, diphtheria, and acellular pertussis (Tdap, Td) vaccine. You may need a Td booster every 10 years.  Zoster vaccine. You may need this after age 34.  Pneumococcal 13-valent conjugate (PCV13)  vaccine. One dose is recommended after age 34.  Pneumococcal polysaccharide (PPSV23) vaccine. One dose is recommended after age 17. Talk to your health care provider about which screenings and vaccines you need and how often you need them. This information is not intended to replace advice given to you by your health care provider. Make sure you discuss any questions you have with your health care provider. Document Released: 11/19/2015 Document Revised: 07/12/2016 Document Reviewed: 08/24/2015 Elsevier Interactive Patient Education  2017 [employer] Prevention in the Home Falls can cause injuries. They can happen to people of all ages. There are many things you can do to make your home safe and to help prevent falls. What can I do on the outside of my home?  Regularly fix the edges of walkways and driveways and fix any cracks.  Remove anything that might make you trip as you walk through a door, such as a raised step or threshold.  Trim any bushes or trees on the path to your home.  Use bright outdoor lighting.  Clear any walking paths of anything that might make someone trip, such as rocks or tools.  Regularly check to see if handrails are loose or broken. Make sure that both sides of any steps have handrails.  Any raised decks and porches should have guardrails on the edges.  Have any leaves, snow, or ice cleared regularly.  Use sand or salt on walking paths during winter.  Clean up any spills in your garage right away. This includes oil or grease spills. What can I do in the bathroom?  Use night lights.  Install grab bars by the toilet and in the tub and shower. Do not use towel bars as grab bars.  Use non-skid mats or decals in the tub or shower.  If you need to sit down in the shower, use a plastic, non-slip stool.  Keep the floor dry. Clean up any water that spills on the floor as soon as it happens.  Remove soap buildup in the tub or shower regularly.   Attach bath mats securely with double-sided non-slip rug tape.  Do not have throw rugs and other things on the floor that can make you trip. What can I do in the bedroom?  Use night lights.  Make sure that you have a light by your bed that is easy to reach.  Do not use any sheets or blankets that are too big for your bed. They should not hang down onto the floor.  Have a firm chair that has side arms. You can use this for support while you get dressed.  Do not have throw rugs and other things on the floor that can make you trip. What can I do in the kitchen?  Clean up any spills right away.  Avoid walking on wet floors.  Keep items that you use a lot in easy-to-reach places.  If you need to reach something above you, use a strong step stool that has a grab bar.  Keep electrical cords out of the way.  Do not use floor polish or wax that makes floors slippery. If you must use wax, use non-skid floor wax.  Do not have throw rugs and other  things on the floor that can make you trip. What can I do with my stairs?  Do not leave any items on the stairs.  Make sure that there are handrails on both sides of the stairs and use them. Fix handrails that are broken or loose. Make sure that handrails are as long as the stairways.  Check any carpeting to make sure that it is firmly attached to the stairs. Fix any carpet that is loose or worn.  Avoid having throw rugs at the top or bottom of the stairs. If you do have throw rugs, attach them to the floor with carpet tape.  Make sure that you have a light switch at the top of the stairs and the bottom of the stairs. If you do not have them, ask someone to add them for you. What else can I do to help prevent falls?  Wear shoes that:  Do not have high heels.  Have rubber bottoms.  Are comfortable and fit you well.  Are closed at the toe. Do not wear sandals.  If you use a stepladder:  Make sure that it is fully opened. Do not climb  a closed stepladder.  Make sure that both sides of the stepladder are locked into place.  Ask someone to hold it for you, if possible.  Clearly mark and make sure that you can see:  Any grab bars or handrails.  First and last steps.  Where the edge of each step is.  Use tools that help you move around (mobility aids) if they are needed. These include:  Canes.  Walkers.  Scooters.  Crutches.  Turn on the lights when you go into a dark area. Replace any light bulbs as soon as they burn out.  Set up your furniture so you have a clear path. Avoid moving your furniture around.  If any of your floors are uneven, fix them.  If there are any pets around you, be aware of where they are.  Review your medicines with your doctor. Some medicines can make you feel dizzy. This can increase your chance of falling. Ask your doctor what other things that you can do to help prevent falls. This information is not intended to replace advice given to you by your health care provider. Make sure you discuss any questions you have with your health care provider. Document Released: 08/19/2009 Document Revised: 03/30/2016 Document Reviewed: 11/27/2014 Elsevier Interactive Patient Education  2017 [employer].

## 2019-07-23 ENCOUNTER — Encounter: Payer: Self-pay | Admitting: Family Medicine

## 2019-07-23 ENCOUNTER — Other Ambulatory Visit: Payer: Self-pay

## 2019-07-23 ENCOUNTER — Ambulatory Visit (INDEPENDENT_AMBULATORY_CARE_PROVIDER_SITE_OTHER): Payer: Medicare Other | Admitting: Family Medicine

## 2019-07-23 VITALS — BP 138/80 | HR 43 | Temp 96.9°F | Resp 15 | Wt 160.6 lb

## 2019-07-23 DIAGNOSIS — E785 Hyperlipidemia, unspecified: Secondary | ICD-10-CM | POA: Diagnosis not present

## 2019-07-23 DIAGNOSIS — H6121 Impacted cerumen, right ear: Secondary | ICD-10-CM

## 2019-07-23 DIAGNOSIS — R42 Dizziness and giddiness: Secondary | ICD-10-CM | POA: Diagnosis not present

## 2019-07-23 DIAGNOSIS — Z23 Encounter for immunization: Secondary | ICD-10-CM

## 2019-07-23 DIAGNOSIS — I2510 Atherosclerotic heart disease of native coronary artery without angina pectoris: Secondary | ICD-10-CM

## 2019-07-23 DIAGNOSIS — D692 Other nonthrombocytopenic purpura: Secondary | ICD-10-CM | POA: Diagnosis not present

## 2019-07-23 DIAGNOSIS — I10 Essential (primary) hypertension: Secondary | ICD-10-CM | POA: Diagnosis not present

## 2019-07-23 NOTE — Progress Notes (Signed)
Patient: Benjamin Walls Male    DOB: Mar 28, 1930   85 y.o.   MRN: [id number] Visit Date: 07/23/2019  Today's Provider: Lelon Huh, MD   Chief Complaint  Patient presents with  . Hypertension  . Hyperlipidemia   Subjective:     Dizziness This is a recurrent problem. The current episode started more than 1 month ago. The problem occurs intermittently. The problem has been unchanged. Associated symptoms include vertigo. Pertinent negatives include no abdominal pain, anorexia, arthralgias, change in bowel habit, chest pain, chills, congestion, coughing, diaphoresis, fatigue, fever, headaches, joint swelling, myalgias, nausea, neck pain, numbness, rash, sore throat, swollen glands, urinary symptoms, visual change, vomiting or weakness. The symptoms are aggravated by bending and twisting. He has tried nothing for the symptoms.    Hypertension, follow-up:  BP Readings from Last 3 Encounters:  07/23/19 138/80  05/16/19 (!) 147/74  07/31/18 (!) 182/86    He was last seen for hypertension 11 months ago.  BP at that visit was 182/86 . Management changes since that visit include patient was started back on Losartan-HCTZ 100mg . He reports excellent compliance with treatment. He is not having side effects.  He is exercising. He is adherent to low salt diet.   Outside blood pressures are being checked periodically at local pharmacy. He is experiencing none.  Patient denies chest pain, chest pressure/discomfort, claudication, dyspnea, exertional chest pressure/discomfort, fatigue, irregular heart beat, lower extremity edema, near-syncope, orthopnea, palpitations, paroxysmal nocturnal dyspnea, syncope and tachypnea.   Cardiovascular risk factors include advanced age (older than 103 for men, 62 for women), hypertension and male gender.  Use of agents associated with hypertension: NSAIDS.     Weight trend: stable Wt Readings from Last 3 Encounters:  07/23/19 160 lb 9.6 oz (72.8 kg)   05/16/19 161 lb 12.8 oz (73.4 kg)  07/31/18 160 lb (72.6 kg)    Current diet: well balanced  ------------------------------------------------------------------------  Lipid/Cholesterol, Follow-up:   Last seen for this11 months ago.  Management changes since that visit include none. . Last Lipid Panel:    Component Value Date/Time   CHOL 124 07/31/2018 0904   TRIG 106 07/31/2018 0904   HDL 37 (L) 07/31/2018 0904   CHOLHDL 3.4 07/31/2018 0904   CHOLHDL 3.4 08/07/2017 0846   LDLCALC 66 07/31/2018 0904   LDLCALC 64 08/07/2017 0846    Risk factors for vascular disease include hypertension  He reports excellent compliance with treatment. He is not having side effects.  Current symptoms include none and have been stable. Weight trend: stable Prior visit with dietician: no Current diet: well balanced Current exercise: walking  Wt Readings from Last 3 Encounters:  07/23/19 160 lb 9.6 oz (72.8 kg)  05/16/19 161 lb 12.8 oz (73.4 kg)  07/31/18 160 lb (72.6 kg)    -------------------------------------------------------------------  He also complains of full feeling in right ear.   Allergies  Allergen Reactions  . Lotensin  [Benazepril Hcl]   . Penicillins      Current Outpatient Medications:  .  losartan-hydrochlorothiazide (HYZAAR) 100-25 MG tablet, Take 1 tablet by mouth daily., Disp: 90 tablet, Rfl: 4 .  lovastatin (MEVACOR) 20 MG tablet, TAKE 1 TABLET(20 MG) BY MOUTH DAILY, Disp: 30 tablet, Rfl: 12 .  aspirin 81 MG tablet, Take 1 tablet by mouth daily., Disp: , Rfl:  .  triamcinolone cream (KENALOG) 0.1 %, APPLY EXTERNALLY TO THE AFFECTED AREA TWICE DAILY (Patient not taking: Reported on 07/23/2019), Disp: 30 g, Rfl: 3  Review  of Systems  Constitutional: Negative for chills, diaphoresis, fatigue and fever.  HENT: Negative for congestion and sore throat.   Respiratory: Negative for cough.   Cardiovascular: Negative for chest pain.  Gastrointestinal: Negative for  abdominal pain, anorexia, change in bowel habit, nausea and vomiting.  Musculoskeletal: Negative for arthralgias, joint swelling, myalgias and neck pain.  Skin: Negative for rash.  Neurological: Positive for dizziness and vertigo. Negative for weakness, numbness and headaches.    Social History   Tobacco Use  . Smoking status: Former Smoker    Quit date: 11/06/1962    Years since quitting: 56.7  . Smokeless tobacco: Never Used  Substance Use Topics  . Alcohol use: Yes    Alcohol/week: 2.0 - 3.0 standard drinks    Types: 2 - 3 Glasses of wine per week      Objective:   BP 138/80   Pulse (!) 43   Temp (!) 96.9 F (36.1 C) (Oral)   Resp 15   Wt 160 lb 9.6 oz (72.8 kg)   SpO2 99%   BMI 22.40 kg/m  Vitals:   07/23/19 0832  BP: 138/80  Pulse: (!) 43  Resp: 15  Temp: (!) 96.9 F (36.1 C)  TempSrc: Oral  SpO2: 99%  Weight: 160 lb 9.6 oz (72.8 kg)  Body mass index is 22.4 kg/m.   Physical Exam    General Appearance:    Alert, cooperative, no distress  Eyes:    PERRL, conjunctiva/corneas clear, EOM's intact       Lungs:     Clear to auscultation bilaterally, respirations unlabored  Heart:    Bradycardic. Normal rhythm. No murmurs, rubs, or gallops.   skin:   Scattered senile purpora lesions on both distal extremities.   Neurologic:   Awake, alert, oriented x 3. No apparent focal neurological           defect.      EKG: Frequent PACs    Assessment & Plan    1. Essential hypertension Well controlled.  Continue current medications.   - Comprehensive metabolic panel  2. Dizziness  - CBC - EKG 12-Lead  3. Coronary artery disease involving native coronary artery of native heart without angina pectoris Asymptomatic. Compliant with medication.  Continue aggressive risk factor modification.   - Comprehensive metabolic panel - Lipid panel - EKG 12-Lead  4. Hyperlipidemia, unspecified hyperlipidemia type  - Comprehensive metabolic panel - Lipid panel  5. Senile  purpura ([place])   6. Need for influenza vaccination  - Flu Vaccine QUAD High Dose(Fluad)  7. Excessive cerumen in right ear He is going to try OTC Debrox drops.     Lelon Huh, MD  [facility]

## 2019-07-23 NOTE — Patient Instructions (Signed)
.   Please review the attached list of medications and notify my office if there are any errors.   . Please bring all of your medications to every appointment so we can make sure that our medication list is the same as yours.   Please go to the lab draw station in [address] on the second floor of [facility] . Normal hours are 8:00am to 12:30pm and 1:30pm to 4:00pm Monday through Friday

## 2019-07-24 ENCOUNTER — Telehealth: Payer: Self-pay

## 2019-07-24 LAB — COMPREHENSIVE METABOLIC PANEL
ALT: 25 IU/L (ref 0–44)
AST: 34 IU/L (ref 0–40)
Albumin/Globulin Ratio: 1.7 (ref 1.2–2.2)
Albumin: 4.5 g/dL (ref 3.6–4.6)
Alkaline Phosphatase: 67 IU/L (ref 39–117)
BUN/Creatinine Ratio: 18 (ref 10–24)
BUN: 17 mg/dL (ref 8–27)
Bilirubin Total: 0.7 mg/dL (ref 0.0–1.2)
CO2: 27 mmol/L (ref 20–29)
Calcium: 10 mg/dL (ref 8.6–10.2)
Chloride: 97 mmol/L (ref 96–106)
Creatinine, Ser: 0.96 mg/dL (ref 0.76–1.27)
GFR calc Af Amer: 82 mL/min/{1.73_m2} (ref >59)
GFR calc non Af Amer: 71 mL/min/{1.73_m2} (ref >59)
Globulin, Total: 2.6 g/dL (ref 1.5–4.5)
Glucose: 100 mg/dL — ABNORMAL HIGH (ref 65–99)
Potassium: 4 mmol/L (ref 3.5–5.2)
Sodium: 137 mmol/L (ref 134–144)
Total Protein: 7.1 g/dL (ref 6.0–8.5)

## 2019-07-24 LAB — LIPID PANEL
Chol/HDL Ratio: 3.7 ratio (ref 0.0–5.0)
Cholesterol, Total: 138 mg/dL (ref 100–199)
HDL: 37 mg/dL — ABNORMAL LOW (ref >39)
LDL Chol Calc (NIH): 78 mg/dL (ref 0–99)
Triglycerides: 125 mg/dL (ref 0–149)
VLDL Cholesterol Cal: 23 mg/dL (ref 5–40)

## 2019-07-24 LAB — CBC
Hematocrit: 43.6 % (ref 37.5–51.0)
Hemoglobin: 15.2 g/dL (ref 13.0–17.7)
MCH: 31.1 pg (ref 26.6–33.0)
MCHC: 34.9 g/dL (ref 31.5–35.7)
MCV: 89 fL (ref 79–97)
Platelets: 148 10*3/uL — ABNORMAL LOW (ref 150–450)
RBC: 4.89 x10E6/uL (ref 4.14–5.80)
RDW: 13 % (ref 11.6–15.4)
WBC: 6.2 10*3/uL (ref 3.4–10.8)

## 2019-07-24 NOTE — Telephone Encounter (Signed)
Pt advised.   Thanks,   -Tu Bayle  

## 2019-07-24 NOTE — Telephone Encounter (Signed)
-----   Message from Birdie Sons, MD sent at 07/24/2019  8:17 AM EDT ----- Labs are all normal. Continue current medications.  Check yearly.

## 2019-08-04 ENCOUNTER — Other Ambulatory Visit: Payer: Self-pay | Admitting: Family Medicine

## 2019-08-04 DIAGNOSIS — I10 Essential (primary) hypertension: Secondary | ICD-10-CM

## 2020-06-29 ENCOUNTER — Other Ambulatory Visit: Payer: Self-pay | Admitting: Family Medicine

## 2020-07-15 ENCOUNTER — Telehealth: Payer: Self-pay | Admitting: Family Medicine

## 2020-07-15 DIAGNOSIS — E785 Hyperlipidemia, unspecified: Secondary | ICD-10-CM

## 2020-07-15 DIAGNOSIS — I10 Essential (primary) hypertension: Secondary | ICD-10-CM

## 2020-07-15 DIAGNOSIS — I2510 Atherosclerotic heart disease of native coronary artery without angina pectoris: Secondary | ICD-10-CM

## 2020-07-15 NOTE — Telephone Encounter (Signed)
I haven't seen him in a year, so he really needs to schedule a visit with me. He can cancel the AWV if he wants. If he wants to have labs a day or two before appt then you can order a met c, cbc, and lipids for hyperlipidemia. But he needs to have appointment with me scheduled before placing order.

## 2020-07-15 NOTE — Progress Notes (Deleted)
Subjective:   Benjamin Walls is a 86 y.o. male who presents for Medicare Annual/Subsequent preventive examination.  I connected with Benjamin Walls today by telephone and verified that I am speaking with the correct person using two identifiers. Location patient: home Location provider: work Persons participating in the virtual visit: patient, provider.   I discussed the limitations, risks, security and privacy concerns of performing an evaluation and management service by telephone and the availability of in person appointments. I also discussed with the patient that there may be a patient responsible charge related to this service. The patient expressed understanding and verbally consented to this telephonic visit.    Interactive audio and video telecommunications were attempted between this provider and patient, however failed, due to patient having technical difficulties OR patient did not have access to video capability.  We continued and completed visit with audio only.   Review of Systems    N/A        Objective:    There were no vitals filed for this visit. There is no height or weight on file to calculate BMI.  Advanced Directives 07/15/2019 07/10/2018  Does Patient Have a Medical Advance Directive? No;Yes Yes  Type of Paramedic of [place];Living will Living will;Healthcare Power of [place] in Chart? No - copy requested No - copy requested    Current Medications (verified) Outpatient Encounter Medications as of 07/19/2020  Medication Sig  . aspirin 81 MG tablet Take 1 tablet by mouth daily.  Marland Kitchen losartan-hydrochlorothiazide (HYZAAR) 100-25 MG tablet TAKE 1 TABLET BY MOUTH DAILY  . lovastatin (MEVACOR) 20 MG tablet TAKE 1 TABLET(20 MG) BY MOUTH DAILY  . triamcinolone cream (KENALOG) 0.1 % APPLY EXTERNALLY TO THE AFFECTED AREA TWICE DAILY (Patient not taking: Reported on 07/23/2019)   No facility-administered  encounter medications on file as of 07/19/2020.    Allergies (verified) Lotensin  [benazepril hcl] and Penicillins   History: Past Medical History:  Diagnosis Date  . Hyperlipidemia   . Hypertension    Past Surgical History:  Procedure Laterality Date  . CATARACT EXTRACTION  10/09/2012 and 04/2013  . ERCP  07/27/2009   Impression: Prescence of pancreatic stent. This was found to be patent. -Cholendocholithiasis was found. Complete removal was accomplished by billiary sphincterotomy.- Dilated CBD with single large stone, which was extracted with ballone.   . Exercise Stress test  05/06/1999  . PTCA  06/1999   STENT  . TONSILLECTOMY AND ADENOIDECTOMY     Family History  Problem Relation Age of Onset  . Hypertension Mother   . Heart Problems Father    Social History   Socioeconomic History  . Marital status: Married    Spouse name: Not on file  . Number of children: 2  . Years of education: Not on file  . Highest education level: Bachelor's degree (e.g., BA, AB, BS)  Occupational History  . Occupation: Tour manager in schools in [place]: Works at [employer]: not cuurently working due to [employer]  Tobacco Use  . Smoking status: Former Smoker    Quit date: 11/06/1962    Years since quitting: 57.7  . Smokeless tobacco: Never Used  Vaping Use  . Vaping Use: Never used  Substance and Sexual Activity  . Alcohol use: Yes    Alcohol/week: 2.0 - 3.0 standard drinks    Types: 2 - 3 Glasses of wine per week  .  Drug use: No  . Sexual activity: Not on file  Other Topics Concern  . Not on file  Social History Narrative  . Not on file   Social Determinants of Health   Financial Resource Strain:   . Difficulty of Paying Living Expenses: Not on file  Food Insecurity:   . Worried About Charity fundraiser in the Last Year: Not on file  . Ran Out of Food in the Last Year: Not on file  Transportation Needs:   . Lack of Transportation  (Medical): Not on file  . Lack of Transportation (Non-Medical): Not on file  Physical Activity: Inactive  . Days of Exercise per Week: 0 days  . Minutes of Exercise per Session: 0 min  Stress: No Stress Concern Present  . Feeling of Stress : Not at all  Social Connections: Unknown  . Frequency of Communication with Friends and Family: Patient refused  . Frequency of Social Gatherings with Friends and Family: Patient refused  . Attends Religious Services: Patient refused  . Active Member of Clubs or Organizations: Patient refused  . Attends Archivist Meetings: Patient refused  . Marital Status: Patient refused    Tobacco Counseling Counseling given: Not Answered   Clinical Intake:                 Diabetic? No         Activities of Daily Living No flowsheet data found.  Patient Care Team: Birdie Sons, MD as PCP - General (Family Medicine)  Indicate any recent Medical Services you may have received from other than [facility] providers in the past year (date may be approximate).     Assessment:   This is a routine wellness examination for [name].  Hearing/Vision screen No exam data present  Dietary issues and exercise activities discussed:    Goals    . DIET - INCREASE WATER INTAKE     Recommend increasing water intake to 4-6 glasses a day.       Depression Screen PHQ 2/9 Scores 07/15/2019 07/10/2018 09/18/2016 09/15/2015  PHQ - 2 Score 0 0 0 0    Fall Risk Fall Risk  07/15/2019 07/10/2018 09/18/2016 09/15/2015  Falls in the past year? 0 No No No    Any stairs in or around the home? {YES/NO:21197} If so, are there any without handrails? {YES/NO:21197} Home free of loose throw rugs in walkways, pet beds, electrical cords, etc? Yes  Adequate lighting in your home to reduce risk of falls? Yes   ASSISTIVE DEVICES UTILIZED TO PREVENT FALLS:  Life alert? {YES/NO:21197} Use of a cane, walker or w/c? {YES/NO:21197} Grab bars in the bathroom?  {YES/NO:21197} Shower chair or bench in shower? {YES/NO:21197} Elevated toilet seat or a handicapped toilet? {YES/NO:21197}   Cognitive Function:     6CIT Screen 07/15/2019 07/10/2018  What Year? 0 points 0 points  What month? 0 points 0 points  What time? 0 points 0 points  Count back from 20 0 points 0 points  Months in reverse 0 points 0 points  Repeat phrase 0 points 2 points  Total Score 0 2    Immunizations Immunization History  Administered Date(s) Administered  . Fluad Quad(high Dose 65+) 07/23/2019  . Influenza Split 09/05/2011, 09/06/2012  . Influenza, High Dose Seasonal PF 09/11/2014, 09/18/2016, 08/07/2017, 07/10/2018  . Influenza,inj,Quad PF,6+ Mos 09/09/2013  . Pneumococcal Conjugate-13 09/11/2014  . Pneumococcal Polysaccharide-23 09/02/2008  . Tdap 09/05/2011  . Zoster 08/30/2010  . Zoster Recombinat (Shingrix) 07/28/2019  TDAP status: Up to date Flu Vaccine status: Declined, Education has been provided regarding the importance of this vaccine but patient still declined. Advised may receive this vaccine at local pharmacy or Health Dept. Aware to provide a copy of the vaccination record if obtained from local pharmacy or Health Dept. Verbalized acceptance and understanding. Pneumococcal vaccine status: Up to date {Covid-19 vaccine status:2101808}  Qualifies for Shingles Vaccine? Yes   Zostavax completed Yes   {Shingrix Completed?:2101804}  Screening Tests Health Maintenance  Topic Date Due  . COVID-19 Vaccine (1) Never done  . INFLUENZA VACCINE  06/06/2020  . TETANUS/TDAP  09/04/2021  . PNA vac Low Risk Adult  Completed    Health Maintenance  Health Maintenance Due  Topic Date Due  . COVID-19 Vaccine (1) Never done  . INFLUENZA VACCINE  06/06/2020    Colorectal cancer screening: No longer required.   Lung Cancer Screening: (Low Dose CT Chest recommended if Age 31-80 years, 30 pack-year currently smoking OR have quit w/in 15years.) does not  qualify.    Additional Screening:  Vision Screening: Recommended annual ophthalmology exams for early detection of glaucoma and other disorders of the eye. Is the patient up to date with their annual eye exam?  Yes  Who is the provider or what is the name of the office in which the patient attends annual eye exams? *** If pt is not established with a provider, would they like to be referred to a provider to establish care? No .   Dental Screening: Recommended annual dental exams for proper oral hygiene  Community Resource Referral / Chronic Care Management: CRR required this visit?  No   CCM required this visit?  No      Plan:     I have personally reviewed and noted the following in the patient's chart:   . Medical and social history . Use of alcohol, tobacco or illicit drugs  . Current medications and supplements . Functional ability and status . Nutritional status . Physical activity . Advanced directives . List of other physicians . Hospitalizations, surgeries, and ER visits in previous 12 months . Vitals . Screenings to include cognitive, depression, and falls . Referrals and appointments  In addition, I have reviewed and discussed with patient certain preventive protocols, quality metrics, and best practice recommendations. A written personalized care plan for preventive services as well as general preventive health recommendations were provided to patient.     Emmilee Reamer [place], [place]   02/12/1790   Nurse Notes: ***

## 2020-07-15 NOTE — Telephone Encounter (Signed)
Patient came into the office upset because someone just called him to let him know he had an annual wellness exam scheduled for 07/19/20 at 2:40 p.m.  He said there has been no changes in his medical issues and does not want to do the AWE.  He only wants to have the lab work done.   Can we cancel the appt. With McKenzie and do only labs? Please let patient know by 07/16/20 as he is going out of town for the weekend.

## 2020-07-16 NOTE — Addendum Note (Signed)
Addended by: Gerald Stabs on: 07/16/2020 04:17 PM   Modules accepted: Orders

## 2020-07-16 NOTE — Telephone Encounter (Signed)
Patient advised and lab orders will be placed and left on [address] side.

## 2020-07-16 NOTE — Telephone Encounter (Signed)
Called to advise patient as below, LVMTCB.

## 2020-07-21 DIAGNOSIS — E785 Hyperlipidemia, unspecified: Secondary | ICD-10-CM | POA: Diagnosis not present

## 2020-07-21 DIAGNOSIS — I10 Essential (primary) hypertension: Secondary | ICD-10-CM | POA: Diagnosis not present

## 2020-07-21 DIAGNOSIS — I2510 Atherosclerotic heart disease of native coronary artery without angina pectoris: Secondary | ICD-10-CM | POA: Diagnosis not present

## 2020-07-22 LAB — CBC WITH DIFFERENTIAL/PLATELET
Basophils Absolute: 0 10*3/uL (ref 0.0–0.2)
Basos: 1 %
EOS (ABSOLUTE): 0.2 10*3/uL (ref 0.0–0.4)
Eos: 2 %
Hematocrit: 42.6 % (ref 37.5–51.0)
Hemoglobin: 15 g/dL (ref 13.0–17.7)
Immature Grans (Abs): 0 10*3/uL (ref 0.0–0.1)
Immature Granulocytes: 0 %
Lymphocytes Absolute: 1.2 10*3/uL (ref 0.7–3.1)
Lymphs: 18 %
MCH: 32 pg (ref 26.6–33.0)
MCHC: 35.2 g/dL (ref 31.5–35.7)
MCV: 91 fL (ref 79–97)
Monocytes Absolute: 0.5 10*3/uL (ref 0.1–0.9)
Monocytes: 7 %
Neutrophils Absolute: 4.8 10*3/uL (ref 1.4–7.0)
Neutrophils: 72 %
Platelets: 162 10*3/uL (ref 150–450)
RBC: 4.69 x10E6/uL (ref 4.14–5.80)
RDW: 13.2 % (ref 11.6–15.4)
WBC: 6.7 10*3/uL (ref 3.4–10.8)

## 2020-07-22 LAB — LIPID PANEL
Chol/HDL Ratio: 3.5 ratio (ref 0.0–5.0)
Cholesterol, Total: 136 mg/dL (ref 100–199)
HDL: 39 mg/dL — ABNORMAL LOW (ref >39)
LDL Chol Calc (NIH): 77 mg/dL (ref 0–99)
Triglycerides: 109 mg/dL (ref 0–149)
VLDL Cholesterol Cal: 20 mg/dL (ref 5–40)

## 2020-07-22 LAB — COMPREHENSIVE METABOLIC PANEL
ALT: 20 IU/L (ref 0–44)
AST: 27 IU/L (ref 0–40)
Albumin/Globulin Ratio: 1.8 (ref 1.2–2.2)
Albumin: 4.6 g/dL (ref 3.6–4.6)
Alkaline Phosphatase: 65 IU/L (ref 44–121)
BUN/Creatinine Ratio: 24 (ref 10–24)
BUN: 22 mg/dL (ref 8–27)
Bilirubin Total: 0.7 mg/dL (ref 0.0–1.2)
CO2: 27 mmol/L (ref 20–29)
Calcium: 10 mg/dL (ref 8.6–10.2)
Chloride: 98 mmol/L (ref 96–106)
Creatinine, Ser: 0.9 mg/dL (ref 0.76–1.27)
GFR calc Af Amer: 88 mL/min/{1.73_m2} (ref >59)
GFR calc non Af Amer: 76 mL/min/{1.73_m2} (ref >59)
Globulin, Total: 2.5 g/dL (ref 1.5–4.5)
Glucose: 95 mg/dL (ref 65–99)
Potassium: 4.2 mmol/L (ref 3.5–5.2)
Sodium: 138 mmol/L (ref 134–144)
Total Protein: 7.1 g/dL (ref 6.0–8.5)

## 2020-07-23 ENCOUNTER — Other Ambulatory Visit: Payer: Self-pay

## 2020-07-23 ENCOUNTER — Ambulatory Visit (INDEPENDENT_AMBULATORY_CARE_PROVIDER_SITE_OTHER): Payer: Medicare Other | Admitting: Family Medicine

## 2020-07-23 ENCOUNTER — Encounter: Payer: Self-pay | Admitting: Family Medicine

## 2020-07-23 VITALS — BP 158/89 | HR 64 | Temp 98.3°F | Resp 16 | Ht 71.0 in | Wt 161.0 lb

## 2020-07-23 DIAGNOSIS — L989 Disorder of the skin and subcutaneous tissue, unspecified: Secondary | ICD-10-CM

## 2020-07-23 DIAGNOSIS — D692 Other nonthrombocytopenic purpura: Secondary | ICD-10-CM

## 2020-07-23 DIAGNOSIS — Z23 Encounter for immunization: Secondary | ICD-10-CM

## 2020-07-23 DIAGNOSIS — Z0000 Encounter for general adult medical examination without abnormal findings: Secondary | ICD-10-CM

## 2020-07-23 DIAGNOSIS — I10 Essential (primary) hypertension: Secondary | ICD-10-CM | POA: Diagnosis not present

## 2020-07-23 DIAGNOSIS — I2510 Atherosclerotic heart disease of native coronary artery without angina pectoris: Secondary | ICD-10-CM | POA: Diagnosis not present

## 2020-07-23 MED ORDER — AMLODIPINE BESYLATE 2.5 MG PO TABS: 2.5000 mg | ORAL_TABLET | Freq: Every day | ORAL | 3 refills | Status: DC

## 2020-07-23 NOTE — Patient Instructions (Signed)
.   Please review the attached list of medications and notify my office if there are any errors.   . Please bring all of your medications to every appointment so we can make sure that our medication list is the same as yours.   

## 2020-07-23 NOTE — Progress Notes (Signed)
Annual Wellness Visit     Patient: Benjamin Walls, Male    DOB: Apr 25, 1930, 85 y.o.   MRN: [id number] Visit Date: 07/23/2020  Today's Provider: Lelon Huh, MD   Chief Complaint  Patient presents with  . Coronary Artery Disease  . Hyperlipidemia  . Hypertension  . Medicare Wellness   Subjective    Benjamin Walls is a 86 y.o. male who presents today for his Annual Wellness Visit. He reports consuming a general diet. Home exercise routine includes walking 45 minutes hrs per days. He generally feels fairly well. He reports sleeping fairly well. He does have additional problems to discuss today.   He states he felt a large scabby lesion on the top of his scalp that occasionally drains a small amount of purulent material a few months ago.   HPI Hypertension, follow-up  BP Readings from Last 3 Encounters:  07/23/20 (!) 158/89  07/23/19 138/80  05/16/19 (!) 147/74   Wt Readings from Last 3 Encounters:  07/23/20 161 lb (73 kg)  07/23/19 160 lb 9.6 oz (72.8 kg)  05/16/19 161 lb 12.8 oz (73.4 kg)     He was last seen for hypertension 1 year ago.  BP at that visit was 138/80. Management since that visit includes continue same medication.  He reports good compliance with treatment. He is not having side effects.  He is following a Regular diet. He is exercising. He does not smoke.  Use of agents associated with hypertension: NSAIDS.   Outside blood pressures are not checked. Symptoms: No chest pain No chest pressure  No palpitations No syncope  No dyspnea No orthopnea  No paroxysmal nocturnal dyspnea No lower extremity edema   Pertinent labs: Lab Results  Component Value Date   CHOL 136 07/21/2020   HDL 39 (L) 07/21/2020   LDLCALC 77 07/21/2020   TRIG 109 07/21/2020   CHOLHDL 3.5 07/21/2020   Lab Results  Component Value Date   NA 138 07/21/2020   K 4.2 07/21/2020   CREATININE 0.90 07/21/2020   GFRNONAA 76 07/21/2020   GFRAA 88 07/21/2020   GLUCOSE 95  07/21/2020     The ASCVD Risk score (Goff DC Jr., et al., 2013) failed to calculate for the following reasons:   The 2013 ASCVD risk score is only valid for ages 27 to 62   ---------------------------------------------------------------------------------------------------  Lipid/Cholesterol, Follow-up  Last lipid panel Other pertinent labs  Lab Results  Component Value Date   CHOL 136 07/21/2020   HDL 39 (L) 07/21/2020   LDLCALC 77 07/21/2020   TRIG 109 07/21/2020   CHOLHDL 3.5 07/21/2020   Lab Results  Component Value Date   ALT 20 07/21/2020   AST 27 07/21/2020   PLT 162 07/21/2020     He was last seen for this 1 year ago.  Management since that visit includes continue same medication.  He reports good compliance with treatment. He is not having side effects.   Symptoms: No chest pain No chest pressure/discomfort  No dyspnea No lower extremity edema  No numbness or tingling of extremity No orthopnea  No palpitations No paroxysmal nocturnal dyspnea  No speech difficulty No syncope    Current exercise: walking  The ASCVD Risk score Mikey Bussing DC Jr., et al., 2013) failed to calculate for the following reasons:   The 2013 ASCVD risk score is only valid for ages 32 to 38  ---------------------------------------------------------------------------------------------------  Follow up for CAD:  The patient was last seen for this 1  year ago. Changes made at last visit include none; continue same medications.  He reports good compliance with treatment. He feels that condition is Unchanged. He is not having side effects.   -----------------------------------------------------------------------------------------      Medications: Outpatient Medications Prior to Visit  Medication Sig  . aspirin 81 MG tablet Take 1 tablet by mouth every other day.   . losartan-hydrochlorothiazide (HYZAAR) 100-25 MG tablet TAKE 1 TABLET BY MOUTH DAILY  . lovastatin (MEVACOR) 20 MG  tablet TAKE 1 TABLET(20 MG) BY MOUTH DAILY  . triamcinolone cream (KENALOG) 0.1 % APPLY EXTERNALLY TO THE AFFECTED AREA TWICE DAILY   No facility-administered medications prior to visit.    Allergies  Allergen Reactions  . Lotensin  [Benazepril Hcl]   . Penicillins     Patient Care Team: Birdie Sons, MD as PCP - General (Family Medicine)  Review of Systems  Constitutional: Negative for appetite change, chills, fatigue and fever.  HENT: Negative for congestion, ear pain, hearing loss, nosebleeds and trouble swallowing.   Eyes: Negative for pain and visual disturbance.  Respiratory: Negative for cough, chest tightness and shortness of breath.   Cardiovascular: Negative for chest pain, palpitations and leg swelling.  Gastrointestinal: Negative for abdominal pain, blood in stool, constipation, diarrhea, nausea and vomiting.  Endocrine: Negative for polydipsia, polyphagia and polyuria.  Genitourinary: Negative for dysuria and flank pain.  Musculoskeletal: Negative for arthralgias, back pain, joint swelling, myalgias and neck stiffness.  Skin: Negative for color change, rash and wound.  Neurological: Positive for light-headedness. Negative for dizziness, tremors, seizures, speech difficulty, weakness and headaches.  Psychiatric/Behavioral: Negative for behavioral problems, confusion, decreased concentration, dysphoric mood and sleep disturbance. The patient is hyperactive. The patient is not nervous/anxious.   All other systems reviewed and are negative.     Objective    Vitals: BP (!) 158/89 (BP Location: Right Arm, Patient Position: Sitting, Cuff Size: Normal)   Pulse 64   Temp 98.3 F (36.8 C) (Oral)   Resp 16   Ht 5\' 11"  (1.803 m)   Wt 161 lb (73 kg)   BMI 22.45 kg/m    Physical Exam  General: Appearance:    Well developed, well nourished male in no acute distress  Eyes:    PERRL, conjunctiva/corneas clear, EOM's intact       Lungs:     Clear to auscultation  bilaterally, respirations unlabored  Heart:    Normal heart rate. Normal rhythm. No murmurs, rubs, or gallops.   MS:   All extremities are intact.   Neurologic:   Awake, alert, oriented x 3. No apparent focal neurological           defect.   Derm:   Scattered senile purpura. About 8cm scaly eruption top of scalp with slightly erythematous surrounding skin. No discharge.      Most recent functional status assessment: In your present state of health, do you have any difficulty performing the following activities: 07/23/2020  Hearing? N  Vision? N  Difficulty concentrating or making decisions? Y  Walking or climbing stairs? N  Dressing or bathing? N  Doing errands, shopping? N  Some recent data might be hidden   Most recent fall risk assessment: Fall Risk  07/23/2020  Falls in the past year? 0  Number falls in past yr: 0  Injury with Fall? 0  Follow up Falls evaluation completed    Most recent depression screenings: PHQ 2/9 Scores 07/23/2020 07/15/2019  PHQ - 2 Score 0 0  Most recent cognitive screening: 6CIT Screen 07/23/2020  What Year? 0 points  What month? 0 points  What time? 0 points  Count back from 20 0 points  Months in reverse 0 points  Repeat phrase 0 points  Total Score 0   Most recent Audit-C alcohol use screening Alcohol Use Disorder Test (AUDIT) 07/23/2020  1. How often do you have a drink containing alcohol? 2  2. How many drinks containing alcohol do you have on a typical day when you are drinking? 0  3. How often do you have six or more drinks on one occasion? 0  AUDIT-C Score 2  Alcohol Brief Interventions/Follow-up AUDIT Score <7 follow-up not indicated   A score of 3 or more in women, and 4 or more in men indicates increased risk for alcohol abuse, EXCEPT if all of the points are from question 1   No results found for any visits on 07/23/20.  Assessment & Plan     Annual wellness visit done today including the all of the following: Reviewed patient's  Family Medical History Reviewed and updated list of patient's medical providers Assessment of cognitive impairment was done Assessed patient's functional ability Established a written schedule for health screening [place] Completed and Reviewed  Exercise Activities and Dietary recommendations Goals    . DIET - INCREASE WATER INTAKE     Recommend increasing water intake to 4-6 glasses a day.        Immunization History  Administered Date(s) Administered  . Fluad Quad(high Dose 65+) 07/23/2019  . Influenza Split 09/05/2011, 09/06/2012  . Influenza, High Dose Seasonal PF 09/11/2014, 09/18/2016, 08/07/2017, 07/10/2018  . Influenza,inj,Quad PF,6+ Mos 09/09/2013  . PFIZER SARS-COV-2 Vaccination 12/02/2019, 12/23/2019  . Pneumococcal Conjugate-13 09/11/2014  . Pneumococcal Polysaccharide-23 09/02/2008  . Tdap 09/05/2011  . Zoster 08/30/2010  . Zoster Recombinat (Shingrix) 07/28/2019    Health Maintenance  Topic Date Due  . INFLUENZA VACCINE  06/06/2020  . TETANUS/TDAP  09/04/2021  . COVID-19 Vaccine  Completed  . PNA vac Low Risk Adult  Completed     Discussed health benefits of physical activity, and encouraged him to engage in regular exercise appropriate for his age and condition.    1. Essential hypertension Uncontrolled, will add- amLODipine (NORVASC) 2.5 MG tablet; Take 1 tablet (2.5 mg total) by mouth daily.  Dispense: 90 tablet; Refill: 3  Return for BP check about 6 weeks.   2. Senile purpura ([place]) He has cut back on ECASA to QOD recommended by his pharmacist which is reasonable.   3. Lesion of skin of scalp Possible a large kerion. Unclear how long this has been there. Needs dermatology evaluation.  - Ambulatory referral to Dermatology  4. Need for influenza vaccination  - Flu Vaccine QUAD High Dose IM (Fluad)  5. Medicare annual wellness visit, subsequent   No follow-ups on file.     The entirety of the information documented in  the History of Present Illness, Review of Systems and Physical Exam were personally obtained by me. Portions of this information were initially documented by the CMA and reviewed by me for thoroughness and accuracy.      Lelon Huh, MD  [facility] [phone] (phone) [phone] (fax)  [place]

## 2020-08-29 ENCOUNTER — Other Ambulatory Visit: Payer: Self-pay | Admitting: Family Medicine

## 2020-08-29 DIAGNOSIS — I10 Essential (primary) hypertension: Secondary | ICD-10-CM

## 2020-08-29 NOTE — Telephone Encounter (Signed)
Requested Prescriptions  Pending Prescriptions Disp Refills  . losartan-hydrochlorothiazide (HYZAAR) 100-25 MG tablet [Pharmacy Med Name: LOSARTAN/HCTZ 100/25MG  TABLETS] 90 tablet 4    Sig: TAKE 1 TABLET BY MOUTH DAILY     Cardiovascular: ARB + Diuretic Combos Failed - 08/29/2020  5:58 PM      Failed - Last BP in normal range    BP Readings from Last 1 Encounters:  07/23/20 (!) 158/89         Failed - Valid encounter within last 6 months    Recent Outpatient Visits          1 month ago Essential hypertension   [place], Donald E, MD   1 year ago Essential hypertension   [place], Kirstie Peri, MD   1 year ago [facility] [place] M, [place]   2 years ago Essential hypertension   [facility] Birdie Sons, MD   2 years ago BPPV (benign paroxysmal positional vertigo), right   [place], [place]      Future Appointments            In 5 days Fisher, Kirstie Peri, MD [facility], [place]   In 1 month Ralene Bathe, MD [place] in normal range and within 180 days    Potassium  Date Value Ref Range Status  07/21/2020 4.2 3.5 - 5.2 mmol/L Final         Passed - Na in normal range and within 180 days    Sodium  Date Value Ref Range Status  07/21/2020 138 134 - 144 mmol/L Final         Passed - Cr in normal range and within 180 days    Creat  Date Value Ref Range Status  08/07/2017 0.91 0.70 - 1.11 mg/dL Final    Comment:    For patients >51 years of age, the reference limit for Creatinine is approximately 13% higher for people identified as African-American. .    Creatinine, Ser  Date Value Ref Range Status  07/21/2020 0.90 0.76 - 1.27 mg/dL Final         Passed - Ca in normal range and within 180 days    Calcium  Date Value Ref Range Status  07/21/2020 10.0 8.6 - 10.2 mg/dL  Final         Passed - Patient is not pregnant

## 2020-09-01 NOTE — Progress Notes (Deleted)
     Established patient visit   Patient: Benjamin Walls   DOB: 1929/12/21   86 y.o. Male  MRN: [id number] Visit Date: 09/03/2020  Today's healthcare provider: Lelon Huh, MD   No chief complaint on file.  Subjective    HPI  Hypertension, follow-up  BP Readings from Last 3 Encounters:  07/23/20 (!) 158/89  07/23/19 138/80  05/16/19 (!) 147/74   Wt Readings from Last 3 Encounters:  07/23/20 161 lb (73 kg)  07/23/19 160 lb 9.6 oz (72.8 kg)  05/16/19 161 lb 12.8 oz (73.4 kg)     He was last seen for hypertension 07/23/2020.  BP at that visit was 158/89. Management since that visit includes adding Amlodipine 2.5mg  daily.  He reports {excellent/good/fair/poor:19665} compliance with treatment. He {is/is not:9024} having side effects. {document side effects if present:1} He is following a {diet:21022986} diet. He {is/is not:9024} exercising. He {does/does not:200015} smoke.  Use of agents associated with hypertension: NSAIDS.   Outside blood pressures are {***enter patient reported home BP readings, or 'not being checked':1}. Symptoms: {Yes/No:20286} chest pain {Yes/No:20286} chest pressure  {Yes/No:20286} palpitations {Yes/No:20286} syncope  {Yes/No:20286} dyspnea {Yes/No:20286} orthopnea  {Yes/No:20286} paroxysmal nocturnal dyspnea {Yes/No:20286} lower extremity edema   Pertinent labs: Lab Results  Component Value Date   CHOL 136 07/21/2020   HDL 39 (L) 07/21/2020   LDLCALC 77 07/21/2020   TRIG 109 07/21/2020   CHOLHDL 3.5 07/21/2020   Lab Results  Component Value Date   NA 138 07/21/2020   K 4.2 07/21/2020   CREATININE 0.90 07/21/2020   GFRNONAA 76 07/21/2020   GFRAA 88 07/21/2020   GLUCOSE 95 07/21/2020     The ASCVD Risk score (Goff DC Jr., et al., 2013) failed to calculate for the following reasons:   The 2013 ASCVD risk score is only valid for ages 48 to 61    ---------------------------------------------------------------------------------------------------  {Show patient history (optional):23778::" "}   Medications: Outpatient Medications Prior to Visit  Medication Sig  . amLODipine (NORVASC) 2.5 MG tablet Take 1 tablet (2.5 mg total) by mouth daily.  Marland Kitchen aspirin 81 MG tablet Take 1 tablet by mouth every other day.   . losartan-hydrochlorothiazide (HYZAAR) 100-25 MG tablet TAKE 1 TABLET BY MOUTH DAILY  . lovastatin (MEVACOR) 20 MG tablet TAKE 1 TABLET(20 MG) BY MOUTH DAILY  . triamcinolone cream (KENALOG) 0.1 % APPLY EXTERNALLY TO THE AFFECTED AREA TWICE DAILY   No facility-administered medications prior to visit.    Review of Systems  {Heme  Chem  Endocrine  Serology  Results Review (optional):23779::" "}  Objective    There were no vitals taken for this visit. {Show previous vital signs (optional):23777::" "}  Physical Exam  ***  No results found for any visits on 09/03/20.  Assessment & Plan     ***  No follow-ups on file.      {provider attestation***:1}   Lelon Huh, MD  [facility] [phone] (phone) [phone] (fax)  [place]

## 2020-09-03 ENCOUNTER — Ambulatory Visit: Payer: Self-pay | Admitting: Family Medicine

## 2020-10-07 ENCOUNTER — Ambulatory Visit (INDEPENDENT_AMBULATORY_CARE_PROVIDER_SITE_OTHER): Payer: Medicare Other | Admitting: Adult Health

## 2020-10-07 ENCOUNTER — Other Ambulatory Visit: Payer: Self-pay

## 2020-10-07 DIAGNOSIS — Z23 Encounter for immunization: Secondary | ICD-10-CM | POA: Diagnosis not present

## 2020-10-10 DIAGNOSIS — Z23 Encounter for immunization: Secondary | ICD-10-CM | POA: Insufficient documentation

## 2020-10-10 NOTE — Progress Notes (Signed)
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

## 2020-10-21 ENCOUNTER — Ambulatory Visit: Payer: Self-pay | Admitting: Dermatology

## 2020-11-26 ENCOUNTER — Other Ambulatory Visit: Payer: Self-pay | Admitting: Family Medicine

## 2021-03-31 ENCOUNTER — Telehealth: Payer: Self-pay | Admitting: *Deleted

## 2021-03-31 ENCOUNTER — Ambulatory Visit: Payer: Self-pay | Admitting: *Deleted

## 2021-03-31 NOTE — Telephone Encounter (Signed)
FYI

## 2021-03-31 NOTE — Telephone Encounter (Signed)
Pt called with complaints of dizziness and feeling off; it is worsened when he moves his head left or right;   HR 56-63  Oxygen 99% BP 167/85  03/31/21 at 1101; the pt sees Dr Caryn Section at [facility]; this provider has no availability; spoke with  Sharyn Lull regarding scheduling pt; she recommends pt be evaluated at Urgent Care; pt notified and verbalized understanding; he will go to the [facility] for evaluation; will route to office for notification.  Reason for Disposition . [1] Dizziness caused by heat exposure, sudden standing, or poor fluid intake AND [2] no improvement after 2 hours of rest and fluids  Answer Assessment - Initial Assessment Questions 1. DESCRIPTION: "Describe your dizziness."      Feels off 2. LIGHTHEADED: "Do you feel lightheaded?" (e.g., somewhat faint, woozy, weak upon standing)    Turing head left of right quickly 3. VERTIGO: "Do you feel like either you or the room is spinning or tilting?" (i.e. vertigo)     no 4. SEVERITY: "How bad is it?"  "Do you feel like you are going to faint?" "Can you stand and walk?"   - MILD: Feels slightly dizzy, but walking normally.   - MODERATE: Feels unsteady when walking, but not falling; interferes with normal activities (e.g., school, work).   - SEVERE: Unable to walk without falling, or requires assistance to walk without falling; feels like passing out now.      mild 5. ONSET:  "When did the dizziness begin?"     03/28/21 6. AGGRAVATING FACTORS: "Does anything make it worse?" (e.g., standing, change in head position)    Changing position of head 7. HEART RATE: "Can you tell me your heart rate?" "How many beats in 15 seconds?"  (Note: not all patients can do this)       HR 56-63  Oxygen 99% BP 167/85  03/31/21 at 1101 8. CAUSE: "What do you think is causing the dizziness?"  not sure 9. RECURRENT SYMPTOM: "Have you had dizziness before?" If Yes, ask: "When was the last time?" "What happened that time?"     "A while  back" 6 months to 1 year; symptoms resolved 10. OTHER SYMPTOMS: "Do you have any other symptoms?" (e.g., fever, chest pain, vomiting, diarrhea, bleeding)       no 11. PREGNANCY: "Is there any chance you are pregnant?" "When was your last menstrual period?"       n/a  Protocols used: DIZZINESS [facility]

## 2021-03-31 NOTE — Telephone Encounter (Signed)
Patient's wife calling back after a nurse triage call. Explained to her we don't have any availability and urged her to take the patient to UC at this time for increased dizzy episodes. Benjamin Walls stated she would take him.

## 2021-07-03 ENCOUNTER — Other Ambulatory Visit: Payer: Self-pay | Admitting: Family Medicine

## 2021-07-03 NOTE — Telephone Encounter (Signed)
Requested medication (s) are due for refill today: yes  Requested medication (s) are on the active medication list: yes  Last refill:  11/26/20  Future visit scheduled: no  Notes to clinic:  Attempted to call pt and I could hear him but he could not hear me. I called same number back and it did not go through.  Needs appt   Requested Prescriptions  Pending Prescriptions Disp Refills   lovastatin (MEVACOR) 20 MG tablet [Pharmacy Med Name: LOVASTATIN '20MG'$  TABLETS] 30 tablet 5    Sig: TAKE 1 TABLET(20 MG) BY MOUTH DAILY     Cardiovascular:  Antilipid - Statins Failed - 07/03/2021  6:52 AM      Failed - HDL in normal range and within 360 days    HDL  Date Value Ref Range Status  07/21/2020 39 (L) >39 mg/dL Final          Failed - Valid encounter within last 12 months    Recent Outpatient Visits           11 months ago Essential hypertension   [facility] Birdie Sons, MD   1 year ago Essential hypertension   [facility] Birdie Sons, MD   2 years ago [place] Carles Collet M, [place]   2 years ago Essential hypertension   [facility] Birdie Sons, MD   3 years ago BPPV (benign paroxysmal positional vertigo), right   [facility] Fenton Malling M, [place]              Passed - Total Cholesterol in normal range and within 360 days    Cholesterol, Total  Date Value Ref Range Status  07/21/2020 136 100 - 199 mg/dL Final          Passed - LDL in normal range and within 360 days    LDL Cholesterol (Calc)  Date Value Ref Range Status  08/07/2017 64 mg/dL (calc) Final    Comment:    Reference range: <100 . Desirable range <100 mg/dL for primary prevention;   <70 mg/dL for patients with CHD or diabetic patients  with > or = 2 CHD risk factors. Marland Kitchen LDL-C is now calculated using the Martin-Hopkins  calculation, which is a validated novel method providing   better accuracy than the Friedewald equation in the  estimation of LDL-C.  Cresenciano Genre et al. Annamaria Helling. [id number]): 2061-2068  (http://education.[employer].com/faq/FAQ164)    LDL Chol Calc (NIH)  Date Value Ref Range Status  07/21/2020 77 0 - 99 mg/dL Final          Passed - Triglycerides in normal range and within 360 days    Triglycerides  Date Value Ref Range Status  07/21/2020 109 0 - 149 mg/dL Final          Passed - Patient is not pregnant

## 2021-07-03 NOTE — Telephone Encounter (Signed)
Requested medication (s) are due for refill today: yes  Requested medication (s) are on the active medication list: yes  Last refill:  11/26/20  Future visit scheduled: no  Notes to clinic:  Attempted to call pt but he could not hear me. Called again and call would not go through.   Requested Prescriptions  Pending Prescriptions Disp Refills   lovastatin (MEVACOR) 20 MG tablet [Pharmacy Med Name: LOVASTATIN '20MG'$  TABLETS] 30 tablet 5    Sig: TAKE 1 TABLET(20 MG) BY MOUTH DAILY     Cardiovascular:  Antilipid - Statins Failed - 07/03/2021 10:09 AM      Failed - HDL in normal range and within 360 days    HDL  Date Value Ref Range Status  07/21/2020 39 (L) >39 mg/dL Final          Failed - Valid encounter within last 12 months    Recent Outpatient Visits           11 months ago Essential hypertension   [facility] Birdie Sons, MD   1 year ago Essential hypertension   [facility] Birdie Sons, MD   2 years ago [place] Carles Collet M, [place]   2 years ago Essential hypertension   [facility] Birdie Sons, MD   3 years ago BPPV (benign paroxysmal positional vertigo), right   [facility] Fenton Malling M, [place]              Passed - Total Cholesterol in normal range and within 360 days    Cholesterol, Total  Date Value Ref Range Status  07/21/2020 136 100 - 199 mg/dL Final          Passed - LDL in normal range and within 360 days    LDL Cholesterol (Calc)  Date Value Ref Range Status  08/07/2017 64 mg/dL (calc) Final    Comment:    Reference range: <100 . Desirable range <100 mg/dL for primary prevention;   <70 mg/dL for patients with CHD or diabetic patients  with > or = 2 CHD risk factors. Marland Kitchen LDL-C is now calculated using the Martin-Hopkins  calculation, which is a validated novel method providing  better accuracy than the Friedewald equation in  the  estimation of LDL-C.  Cresenciano Genre et al. Annamaria Helling. [id number]): 2061-2068  (http://education.[employer].com/faq/FAQ164)    LDL Chol Calc (NIH)  Date Value Ref Range Status  07/21/2020 77 0 - 99 mg/dL Final          Passed - Triglycerides in normal range and within 360 days    Triglycerides  Date Value Ref Range Status  07/21/2020 109 0 - 149 mg/dL Final          Passed - Patient is not pregnant

## 2021-07-04 ENCOUNTER — Other Ambulatory Visit: Payer: Self-pay | Admitting: Family Medicine

## 2021-07-04 NOTE — Telephone Encounter (Signed)
Ordered 07/04/21 pt has not picked up prescription at pharmacy.

## 2021-08-02 ENCOUNTER — Telehealth: Payer: Self-pay

## 2021-08-02 ENCOUNTER — Other Ambulatory Visit: Payer: Self-pay | Admitting: Family Medicine

## 2021-08-02 ENCOUNTER — Ambulatory Visit: Payer: Self-pay | Admitting: *Deleted

## 2021-08-02 DIAGNOSIS — E785 Hyperlipidemia, unspecified: Secondary | ICD-10-CM

## 2021-08-02 DIAGNOSIS — I10 Essential (primary) hypertension: Secondary | ICD-10-CM

## 2021-08-02 NOTE — Telephone Encounter (Signed)
Requested medication (s) are due for refill today: yes  Requested medication (s) are on the active medication list: yes  Last refill:  07/04/21 #30 0 refills  Future visit scheduled: no  Notes to clinic:  requesting 90 day refill. Called patient to schedule future appt. No answer left message to call clinic back. Do you want courtesy refill #30 or can patient have #90 ?     Requested Prescriptions  Pending Prescriptions Disp Refills   lovastatin (MEVACOR) 20 MG tablet [Pharmacy Med Name: LOVASTATIN 20MG  TABLETS] 90 tablet     Sig: TAKE 1 TABLET(20 MG) BY MOUTH AT BEDTIME     Cardiovascular:  Antilipid - Statins Failed - 08/02/2021  8:44 AM      Failed - Total Cholesterol in normal range and within 360 days    Cholesterol, Total  Date Value Ref Range Status  07/21/2020 136 100 - 199 mg/dL Final          Failed - LDL in normal range and within 360 days    LDL Cholesterol (Calc)  Date Value Ref Range Status  08/07/2017 64 mg/dL (calc) Final    Comment:    Reference range: <100 . Desirable range <100 mg/dL for primary prevention;   <70 mg/dL for patients with CHD or diabetic patients  with > or = 2 CHD risk factors. Marland Kitchen LDL-C is now calculated using the Martin-Hopkins  calculation, which is a validated novel method providing  better accuracy than the Friedewald equation in the  estimation of LDL-C.  Cresenciano Genre et al. Annamaria Helling. 3810;175(10): 2061-2068  (http://education.[facility].com/faq/FAQ164)    LDL Chol Calc (NIH)  Date Value Ref Range Status  07/21/2020 77 0 - 99 mg/dL Final          Failed - HDL in normal range and within 360 days    HDL  Date Value Ref Range Status  07/21/2020 39 (L) >39 mg/dL Final          Failed - Triglycerides in normal range and within 360 days    Triglycerides  Date Value Ref Range Status  07/21/2020 109 0 - 149 mg/dL Final          Failed - Valid encounter within last 12 months    Recent Outpatient Visits           1 year  ago Essential hypertension   [facility] Birdie Sons, MD   2 years ago Essential hypertension   [facility] Birdie Sons, MD   2 years ago [place] Carles Collet M, [place]   3 years ago Essential hypertension   [facility] Birdie Sons, MD   3 years ago BPPV (benign paroxysmal positional vertigo), right   [facility] [place], [place]              Passed - Patient is not pregnant       lovastatin (MEVACOR) 20 MG tablet [Pharmacy Med Name: LOVASTATIN 20MG  TABLETS] 30 tablet 0    Sig: TAKE 1 TABLET(20 MG) BY MOUTH AT BEDTIME     Cardiovascular:  Antilipid - Statins Failed - 08/02/2021  8:44 AM      Failed - Total Cholesterol in normal range and within 360 days    Cholesterol, Total  Date Value Ref Range Status  07/21/2020 136 100 - 199 mg/dL Final          Failed - LDL in normal range and within  360 days    LDL Cholesterol (Calc)  Date Value Ref Range Status  08/07/2017 64 mg/dL (calc) Final    Comment:    Reference range: <100 . Desirable range <100 mg/dL for primary prevention;   <70 mg/dL for patients with CHD or diabetic patients  with > or = 2 CHD risk factors. Marland Kitchen LDL-C is now calculated using the Martin-Hopkins  calculation, which is a validated novel method providing  better accuracy than the Friedewald equation in the  estimation of LDL-C.  Cresenciano Genre et al. Annamaria Helling. 6203;559(74): 2061-2068  (http://education.[facility].com/faq/FAQ164)    LDL Chol Calc (NIH)  Date Value Ref Range Status  07/21/2020 77 0 - 99 mg/dL Final          Failed - HDL in normal range and within 360 days    HDL  Date Value Ref Range Status  07/21/2020 39 (L) >39 mg/dL Final          Failed - Triglycerides in normal range and within 360 days    Triglycerides  Date Value Ref Range Status  07/21/2020 109 0 - 149 mg/dL Final          Failed - Valid  encounter within last 12 months    Recent Outpatient Visits           1 year ago Essential hypertension   [facility] Birdie Sons, MD   2 years ago Essential hypertension   [facility] Birdie Sons, MD   2 years ago [place], [place]   3 years ago Essential hypertension   [facility] Birdie Sons, MD   3 years ago BPPV (benign paroxysmal positional vertigo), right   [facility], [place], [place] - Patient is not pregnant

## 2021-08-02 NOTE — Telephone Encounter (Signed)
Patient's wife returned call regarding medication refills for mevacor and scheduled patient appt for 1/17/ 23 for annual exam with PCP. Requesting medication refills until next appt. Patient 's wife also requesting if PCP would like patient to get covid booster prior to next OV? Please advise

## 2021-08-02 NOTE — Telephone Encounter (Signed)
Patient is wanting to have his yearly lab work done.  Please let him know when the requisition is ready

## 2021-08-03 DIAGNOSIS — Z23 Encounter for immunization: Secondary | ICD-10-CM | POA: Diagnosis not present

## 2021-08-03 NOTE — Addendum Note (Signed)
Addended by: Birdie Sons on: 08/03/2021 01:12 PM   Modules accepted: Orders

## 2021-08-03 NOTE — Telephone Encounter (Signed)
Tried calling patient. Left message to call back. Ok for PEC triage to advise.  

## 2021-08-03 NOTE — Telephone Encounter (Signed)
Ok, can print and leave order at lab. He needs to be fasting.

## 2021-08-03 NOTE — Telephone Encounter (Signed)
90 day supply of lovastatin sent to pharmacy on 08/02/2021. Please advise on patient's question about the COVID booster.

## 2021-08-04 NOTE — Telephone Encounter (Signed)
He should get the Bivalent covid booster if it's been more than 3 months since his last covid booster.

## 2021-08-05 NOTE — Telephone Encounter (Signed)
Patient advised. Lab order placed up at the front desk of [address].

## 2021-08-05 NOTE — Telephone Encounter (Signed)
Patient advised.

## 2021-08-05 NOTE — Addendum Note (Signed)
Addended by: Randal Buba on: 08/05/2021 04:29 PM   Modules accepted: Orders

## 2021-08-08 DIAGNOSIS — E785 Hyperlipidemia, unspecified: Secondary | ICD-10-CM | POA: Diagnosis not present

## 2021-08-09 LAB — COMPREHENSIVE METABOLIC PANEL
ALT: 23 IU/L (ref 0–44)
AST: 39 IU/L (ref 0–40)
Albumin/Globulin Ratio: 2.1 (ref 1.2–2.2)
Albumin: 4.5 g/dL (ref 3.6–4.6)
Alkaline Phosphatase: 73 IU/L (ref 44–121)
BUN/Creatinine Ratio: 28 — ABNORMAL HIGH (ref 10–24)
BUN: 24 mg/dL (ref 8–27)
Bilirubin Total: 0.7 mg/dL (ref 0.0–1.2)
CO2: 27 mmol/L (ref 20–29)
Calcium: 10.2 mg/dL (ref 8.6–10.2)
Chloride: 97 mmol/L (ref 96–106)
Creatinine, Ser: 0.85 mg/dL (ref 0.76–1.27)
Globulin, Total: 2.1 g/dL (ref 1.5–4.5)
Glucose: 100 mg/dL — ABNORMAL HIGH (ref 70–99)
Potassium: 4 mmol/L (ref 3.5–5.2)
Sodium: 138 mmol/L (ref 134–144)
Total Protein: 6.6 g/dL (ref 6.0–8.5)
eGFR: 83 mL/min/{1.73_m2} (ref >59)

## 2021-08-09 LAB — CBC
Hematocrit: 41.2 % (ref 37.5–51.0)
Hemoglobin: 14.3 g/dL (ref 13.0–17.7)
MCH: 31.8 pg (ref 26.6–33.0)
MCHC: 34.7 g/dL (ref 31.5–35.7)
MCV: 92 fL (ref 79–97)
Platelets: 158 10*3/uL (ref 150–450)
RBC: 4.5 x10E6/uL (ref 4.14–5.80)
RDW: 13.1 % (ref 11.6–15.4)
WBC: 6.1 10*3/uL (ref 3.4–10.8)

## 2021-08-09 LAB — LIPID PANEL
Chol/HDL Ratio: 3.6 ratio (ref 0.0–5.0)
Cholesterol, Total: 144 mg/dL (ref 100–199)
HDL: 40 mg/dL (ref >39)
LDL Chol Calc (NIH): 88 mg/dL (ref 0–99)
Triglycerides: 83 mg/dL (ref 0–149)
VLDL Cholesterol Cal: 16 mg/dL (ref 5–40)

## 2021-09-04 ENCOUNTER — Other Ambulatory Visit: Payer: Self-pay | Admitting: Family Medicine

## 2021-09-04 DIAGNOSIS — I10 Essential (primary) hypertension: Secondary | ICD-10-CM

## 2021-09-04 NOTE — Telephone Encounter (Signed)
last RF 08/29/20 #90 4 RF

## 2021-09-08 ENCOUNTER — Other Ambulatory Visit: Payer: Self-pay | Admitting: Family Medicine

## 2021-09-08 DIAGNOSIS — I10 Essential (primary) hypertension: Secondary | ICD-10-CM

## 2021-10-31 ENCOUNTER — Other Ambulatory Visit: Payer: Self-pay | Admitting: Family Medicine

## 2021-11-22 ENCOUNTER — Encounter: Payer: Self-pay | Admitting: Family Medicine

## 2021-11-22 ENCOUNTER — Ambulatory Visit (INDEPENDENT_AMBULATORY_CARE_PROVIDER_SITE_OTHER): Payer: Medicare Other | Admitting: Family Medicine

## 2021-11-22 ENCOUNTER — Other Ambulatory Visit: Payer: Self-pay

## 2021-11-22 VITALS — BP 150/70 | HR 59 | Temp 98.7°F | Ht 73.0 in | Wt 162.8 lb

## 2021-11-22 DIAGNOSIS — Z0000 Encounter for general adult medical examination without abnormal findings: Secondary | ICD-10-CM | POA: Diagnosis not present

## 2021-11-22 DIAGNOSIS — D692 Other nonthrombocytopenic purpura: Secondary | ICD-10-CM | POA: Diagnosis not present

## 2021-11-22 DIAGNOSIS — I498 Other specified cardiac arrhythmias: Secondary | ICD-10-CM | POA: Diagnosis not present

## 2021-11-22 DIAGNOSIS — I2510 Atherosclerotic heart disease of native coronary artery without angina pectoris: Secondary | ICD-10-CM | POA: Diagnosis not present

## 2021-11-22 DIAGNOSIS — I10 Essential (primary) hypertension: Secondary | ICD-10-CM | POA: Diagnosis not present

## 2021-11-22 DIAGNOSIS — R011 Cardiac murmur, unspecified: Secondary | ICD-10-CM

## 2021-11-22 DIAGNOSIS — E785 Hyperlipidemia, unspecified: Secondary | ICD-10-CM | POA: Diagnosis not present

## 2021-11-22 DIAGNOSIS — H6121 Impacted cerumen, right ear: Secondary | ICD-10-CM

## 2021-11-22 NOTE — Progress Notes (Signed)
Annual Wellness Visit     Patient: Benjamin Walls, Male    DOB: 1929-12-31, 88 y.o.   MRN: [id number] Visit Date: 11/22/2021  Today's Provider: Lelon Huh, MD   Chief Complaint  Patient presents with   Annual Exam   Hypertension   Hyperlipidemia   Subjective    Benjamin Walls is a 88 y.o. male who presents today for his Annual Wellness Visit. He reports consuming a general and mainly chicken and fish with lots of veggies  diet.  Patient [place] 5 [place] a [place] for one mile that takes at least 40-45 minutes as week as every morning at 7:30 am.  He generally feels well. He reports sleeping well. He does have additional problems to discuss today.   HPI -Patient states his balance is off considerably. IF he turns to quickly he is tripping over his self. States he has a little dizziness if he turns his head to quickly or looks up quickly.  -falls mainly scrapped his arm  -Patient says he received Covid booster and flu vaccine at [facility] in October  Medications: Outpatient Medications Prior to Visit  Medication Sig   amLODipine (NORVASC) 2.5 MG tablet TAKE 1 TABLET(2.5 MG) BY MOUTH DAILY   aspirin 81 MG tablet Take 1 tablet by mouth every other day.    losartan-hydrochlorothiazide (HYZAAR) 100-25 MG tablet TAKE 1 TABLET BY MOUTH DAILY   lovastatin (MEVACOR) 20 MG tablet TAKE 1 TABLET(20 MG) BY MOUTH AT BEDTIME   Multiple Vitamin (MULTIVITAMIN PO) Take by mouth.   triamcinolone cream (KENALOG) 0.1 % APPLY EXTERNALLY TO THE AFFECTED AREA TWICE DAILY   No facility-administered medications prior to visit.    Allergies  Allergen Reactions   Lotensin  [Benazepril Hcl]    Penicillins     Patient Care Team: Birdie Sons, MD as PCP - General (Family Medicine)  Review of Systems      Objective    Vitals: BP (!) 150/70    Pulse (!) 59    Temp 98.7 F (37.1 C) (Oral)    Ht 6\' 1"  (1.854 m)    Wt 162 lb 12.8 oz (73.8 kg)    SpO2 98%    BMI 21.48  kg/m    Physical Exam   General: Appearance:    Well developed, well nourished male in no acute distress  ENT:   Right ear canal obstructed by cerumen. Left canal and TM clear  Eyes:    PERRL, conjunctiva/corneas clear, EOM's intact       Lungs:     Clear to auscultation bilaterally, respirations unlabored  Heart:    Bradycardic. Normal rhythm. No murmurs, rubs, or gallops.    MS:   All extremities are intact.    Neurologic:   Awake, alert, oriented x 3. No apparent focal neurological defect.   Skin:   Several small patches or senile purpura on upper extremity.      EKG: Atrial bigeminy  Most recent functional status assessment: In your present state of health, do you have any difficulty performing the following activities: 11/22/2021  Hearing? N  Vision? N  Difficulty concentrating or making decisions? N  Walking or climbing stairs? N  Dressing or bathing? N  Doing errands, shopping? N  Some recent data might be hidden   Most recent fall risk assessment: Fall Risk  11/22/2021  Falls in the past year? 1  Number falls in past yr: 1  Injury with  Fall? 0  Risk for fall due to : History of fall(s)  Follow up -    Most recent depression screenings: PHQ 2/9 Scores 11/22/2021 07/23/2020  PHQ - 2 Score 0 0  PHQ- 9 Score 0 -   Most recent cognitive screening: 6CIT Screen 07/23/2020  What Year? 0 points  What month? 0 points  What time? 0 points  Count back from 20 0 points  Months in reverse 0 points  Repeat phrase 0 points  Total Score 0   Most recent Audit-C alcohol use screening Alcohol Use Disorder Test (AUDIT) 11/22/2021  1. How often do you have a drink containing alcohol? 1  2. How many drinks containing alcohol do you have on a typical day when you are drinking? 0  3. How often do you have six or more drinks on one occasion? 0  AUDIT-C Score 1  Alcohol Brief Interventions/Follow-up -   A score of 3 or more in women, and 4 or more in men indicates increased risk for  alcohol abuse, EXCEPT if all of the points are from question 1   No results found for any visits on 11/22/21.  Assessment & Plan     Annual wellness visit done today including the all of the following: Reviewed patient's Family Medical History Reviewed and updated list of patient's medical providers Assessment of cognitive impairment was done Assessed patient's functional ability Established a written schedule for health screening [facility] Completed and Reviewed  Exercise Activities and Dietary recommendations  Goals      DIET - INCREASE WATER INTAKE     Recommend increasing water intake to 4-6 glasses a day.         Immunization History  Administered Date(s) Administered   Fluad Quad(high Dose 65+) 07/23/2019, 07/23/2020   Influenza Split 09/05/2011, 09/06/2012   Influenza, High Dose Seasonal PF 09/11/2014, 09/18/2016, 08/07/2017, 07/10/2018   Influenza,inj,Quad PF,6+ Mos 09/09/2013   PFIZER(Purple Top)SARS-COV-2 Vaccination 12/02/2019, 12/23/2019, 10/07/2020   Pneumococcal Conjugate-13 09/11/2014   Pneumococcal Polysaccharide-23 09/02/2008   Tdap 09/05/2011   Zoster Recombinat (Shingrix) 07/28/2019, 10/13/2019   Zoster, Live 08/30/2010    Health Maintenance  Topic Date Due   COVID-19 Vaccine (4 - Booster for Pfizer series) 12/02/2020   TETANUS/TDAP  09/04/2021   INFLUENZA VACCINE  02/03/2022 (Originally 06/06/2021)   Pneumonia Vaccine 86+ Years old  Completed   Zoster Vaccines- Shingrix  Completed   HPV VACCINES  Aged Out     Discussed health benefits of physical activity, and encouraged him to engage in regular exercise appropriate for his age and condition.    1. Hyperlipidemia, unspecified hyperlipidemia type He is tolerating lovastatin well with no adverse effects.    2. Primary hypertension SBP not at goal.  - EKG 12-Lead  3. Coronary artery disease involving native coronary artery of native heart without angina pectoris Remote  PTCA. Asymptomatic. Compliant with medication.  Continue aggressive risk factor modification.    4. Senile purpura (HCC) Stable on current dose of aspirin.   5. Impacted cerumen of right ear After soaking with Debrox, ear canal was irrigated with water until clear. Patient tolerated procedure well.    6. Heart murmur, systolic  7. Atrial bigeminy on EKG Unclear if this is relative to episodes of dizziness. Consider ZIO monitor.     The entirety of the information documented in the History of Present Illness, Review of Systems and Physical Exam were personally obtained by me. Portions of this information were initially documented  by the [facility] and reviewed by me for thoroughness and accuracy.     Lelon Huh, MD  [facility] [phone] (phone) [phone] (fax)  [place]

## 2021-11-29 ENCOUNTER — Ambulatory Visit: Admission: RE | Admit: 2021-11-29 | Payer: Medicare Other | Source: Ambulatory Visit

## 2021-11-29 ENCOUNTER — Other Ambulatory Visit: Payer: Self-pay

## 2021-11-29 ENCOUNTER — Ambulatory Visit
Admission: RE | Admit: 2021-11-29 | Discharge: 2021-11-29 | Disposition: A | Discharge status: Home / Self Care | Payer: Medicare Other | Source: Ambulatory Visit | Attending: Family Medicine | Admitting: Family Medicine

## 2021-11-29 DIAGNOSIS — R011 Cardiac murmur, unspecified: Secondary | ICD-10-CM | POA: Diagnosis not present

## 2021-11-29 DIAGNOSIS — I700 Atherosclerosis of aorta: Secondary | ICD-10-CM | POA: Diagnosis not present

## 2021-11-29 DIAGNOSIS — I340 Nonrheumatic mitral (valve) insufficiency: Secondary | ICD-10-CM | POA: Diagnosis not present

## 2021-11-29 DIAGNOSIS — I498 Other specified cardiac arrhythmias: Secondary | ICD-10-CM

## 2021-11-29 DIAGNOSIS — E785 Hyperlipidemia, unspecified: Secondary | ICD-10-CM | POA: Diagnosis not present

## 2021-11-29 DIAGNOSIS — I10 Essential (primary) hypertension: Secondary | ICD-10-CM | POA: Diagnosis not present

## 2021-11-29 LAB — ECHOCARDIOGRAM COMPLETE
AR max vel: 2.4 cm2
AV Area VTI: 2.49 cm2
AV Area mean vel: 2.48 cm2
AV Mean grad: 2.7 mmHg
AV Peak grad: 4.3 mmHg
Ao pk vel: 1.04 m/s
Area-P 1/2: 3.42 cm2
MV VTI: 2.45 cm2
S' Lateral: 2.3 cm

## 2021-11-29 NOTE — Progress Notes (Signed)
*  PRELIMINARY RESULTS* Echocardiogram 2D Echocardiogram has been performed.  Benjamin Walls 11/29/2021, 12:26 PM

## 2021-11-30 ENCOUNTER — Inpatient Hospital Stay (INDEPENDENT_AMBULATORY_CARE_PROVIDER_SITE_OTHER): Payer: Medicare Other

## 2021-11-30 ENCOUNTER — Other Ambulatory Visit: Payer: Self-pay

## 2021-11-30 DIAGNOSIS — I498 Other specified cardiac arrhythmias: Secondary | ICD-10-CM

## 2021-11-30 DIAGNOSIS — R42 Dizziness and giddiness: Secondary | ICD-10-CM

## 2021-12-05 DIAGNOSIS — R42 Dizziness and giddiness: Secondary | ICD-10-CM

## 2021-12-05 DIAGNOSIS — I498 Other specified cardiac arrhythmias: Secondary | ICD-10-CM

## 2021-12-27 DIAGNOSIS — R42 Dizziness and giddiness: Secondary | ICD-10-CM | POA: Diagnosis not present

## 2021-12-27 DIAGNOSIS — I498 Other specified cardiac arrhythmias: Secondary | ICD-10-CM | POA: Diagnosis not present

## 2022-01-23 ENCOUNTER — Telehealth: Payer: Self-pay

## 2022-01-23 NOTE — Telephone Encounter (Signed)
Patient wants to know when he is due back to see Dr. Caryn Section?  Please advise patient.

## 2022-01-23 NOTE — Telephone Encounter (Signed)
Patient advised and verbalized understanding. He states he would like to call back to schedule appointment.  ?

## 2022-01-23 NOTE — Telephone Encounter (Signed)
He is due for follow up of hypertension and heart arrythmia since he had Zio monitor done in January. Need to schedule appt sometime within the next month.  ?

## 2022-02-04 ENCOUNTER — Other Ambulatory Visit: Payer: Self-pay

## 2022-02-04 ENCOUNTER — Emergency Department: Payer: Medicare Other

## 2022-02-04 ENCOUNTER — Inpatient Hospital Stay
Admission: EM | Admit: 2022-02-04 | Discharge: 2022-02-06 | DRG: 641 | Disposition: A | Discharge status: Home Health | Payer: Medicare Other | Attending: Internal Medicine | Admitting: Internal Medicine

## 2022-02-04 DIAGNOSIS — Z79899 Other long term (current) drug therapy: Secondary | ICD-10-CM

## 2022-02-04 DIAGNOSIS — E785 Hyperlipidemia, unspecified: Secondary | ICD-10-CM | POA: Diagnosis present

## 2022-02-04 DIAGNOSIS — Z20822 Contact with and (suspected) exposure to covid-19: Secondary | ICD-10-CM | POA: Diagnosis present

## 2022-02-04 DIAGNOSIS — K5900 Constipation, unspecified: Secondary | ICD-10-CM | POA: Diagnosis present

## 2022-02-04 DIAGNOSIS — I2510 Atherosclerotic heart disease of native coronary artery without angina pectoris: Secondary | ICD-10-CM | POA: Diagnosis not present

## 2022-02-04 DIAGNOSIS — R531 Weakness: Secondary | ICD-10-CM | POA: Diagnosis not present

## 2022-02-04 DIAGNOSIS — R519 Headache, unspecified: Secondary | ICD-10-CM | POA: Diagnosis not present

## 2022-02-04 DIAGNOSIS — Z66 Do not resuscitate: Secondary | ICD-10-CM | POA: Diagnosis present

## 2022-02-04 DIAGNOSIS — Z888 Allergy status to other drugs, medicaments and biological substances status: Secondary | ICD-10-CM

## 2022-02-04 DIAGNOSIS — Z8249 Family history of ischemic heart disease and other diseases of the circulatory system: Secondary | ICD-10-CM

## 2022-02-04 DIAGNOSIS — Z880 Allergy status to penicillin: Secondary | ICD-10-CM

## 2022-02-04 DIAGNOSIS — I10 Essential (primary) hypertension: Secondary | ICD-10-CM | POA: Diagnosis present

## 2022-02-04 DIAGNOSIS — Z01818 Encounter for other preprocedural examination: Secondary | ICD-10-CM | POA: Diagnosis not present

## 2022-02-04 DIAGNOSIS — Y92009 Unspecified place in unspecified non-institutional (private) residence as the place of occurrence of the external cause: Secondary | ICD-10-CM | POA: Diagnosis not present

## 2022-02-04 DIAGNOSIS — Z7982 Long term (current) use of aspirin: Secondary | ICD-10-CM

## 2022-02-04 DIAGNOSIS — E860 Dehydration: Secondary | ICD-10-CM | POA: Diagnosis not present

## 2022-02-04 DIAGNOSIS — E876 Hypokalemia: Secondary | ICD-10-CM | POA: Diagnosis present

## 2022-02-04 DIAGNOSIS — M542 Cervicalgia: Secondary | ICD-10-CM | POA: Diagnosis not present

## 2022-02-04 DIAGNOSIS — W19XXXA Unspecified fall, initial encounter: Secondary | ICD-10-CM | POA: Diagnosis present

## 2022-02-04 DIAGNOSIS — W010XXA Fall on same level from slipping, tripping and stumbling without subsequent striking against object, initial encounter: Secondary | ICD-10-CM | POA: Diagnosis present

## 2022-02-04 DIAGNOSIS — D696 Thrombocytopenia, unspecified: Secondary | ICD-10-CM | POA: Diagnosis present

## 2022-02-04 DIAGNOSIS — T796XXA Traumatic ischemia of muscle, initial encounter: Secondary | ICD-10-CM | POA: Diagnosis present

## 2022-02-04 DIAGNOSIS — R52 Pain, unspecified: Secondary | ICD-10-CM | POA: Diagnosis not present

## 2022-02-04 DIAGNOSIS — Z87891 Personal history of nicotine dependence: Secondary | ICD-10-CM | POA: Diagnosis not present

## 2022-02-04 DIAGNOSIS — R778 Other specified abnormalities of plasma proteins: Secondary | ICD-10-CM | POA: Diagnosis not present

## 2022-02-04 DIAGNOSIS — R7989 Other specified abnormal findings of blood chemistry: Secondary | ICD-10-CM | POA: Diagnosis present

## 2022-02-04 DIAGNOSIS — R911 Solitary pulmonary nodule: Secondary | ICD-10-CM | POA: Diagnosis not present

## 2022-02-04 DIAGNOSIS — M6282 Rhabdomyolysis: Secondary | ICD-10-CM | POA: Diagnosis present

## 2022-02-04 DIAGNOSIS — E041 Nontoxic single thyroid nodule: Secondary | ICD-10-CM | POA: Diagnosis present

## 2022-02-04 LAB — COMPREHENSIVE METABOLIC PANEL
ALT: 41 U/L (ref 0–44)
AST: 118 U/L — ABNORMAL HIGH (ref 15–41)
Albumin: 4 g/dL (ref 3.5–5.0)
Alkaline Phosphatase: 90 U/L (ref 38–126)
Anion gap: 10 (ref 5–15)
BUN: 53 mg/dL — ABNORMAL HIGH (ref 8–23)
CO2: 30 mmol/L (ref 22–32)
Calcium: 13.4 mg/dL (ref 8.9–10.3)
Chloride: 100 mmol/L (ref 98–111)
Creatinine, Ser: 1.18 mg/dL (ref 0.61–1.24)
GFR, Estimated: 59 mL/min — ABNORMAL LOW (ref >60)
Glucose, Bld: 111 mg/dL — ABNORMAL HIGH (ref 70–99)
Potassium: 3.1 mmol/L — ABNORMAL LOW (ref 3.5–5.1)
Sodium: 140 mmol/L (ref 135–145)
Total Bilirubin: 1.3 mg/dL — ABNORMAL HIGH (ref 0.3–1.2)
Total Protein: 6.7 g/dL (ref 6.5–8.1)

## 2022-02-04 LAB — CBC WITH DIFFERENTIAL/PLATELET
Abs Immature Granulocytes: 0.05 10*3/uL (ref 0.00–0.07)
Basophils Absolute: 0 10*3/uL (ref 0.0–0.1)
Basophils Relative: 0 %
Eosinophils Absolute: 0 10*3/uL (ref 0.0–0.5)
Eosinophils Relative: 0 %
HCT: 37.2 % — ABNORMAL LOW (ref 39.0–52.0)
Hemoglobin: 12.8 g/dL — ABNORMAL LOW (ref 13.0–17.0)
Immature Granulocytes: 0 %
Lymphocytes Relative: 3 %
Lymphs Abs: 0.4 10*3/uL — ABNORMAL LOW (ref 0.7–4.0)
MCH: 30.3 pg (ref 26.0–34.0)
MCHC: 34.4 g/dL (ref 30.0–36.0)
MCV: 88.2 fL (ref 80.0–100.0)
Monocytes Absolute: 1 10*3/uL (ref 0.1–1.0)
Monocytes Relative: 8 %
Neutro Abs: 11.6 10*3/uL — ABNORMAL HIGH (ref 1.7–7.7)
Neutrophils Relative %: 89 %
Platelets: 190 10*3/uL (ref 150–400)
RBC: 4.22 MIL/uL (ref 4.22–5.81)
RDW: 12.9 % (ref 11.5–15.5)
WBC: 13.1 10*3/uL — ABNORMAL HIGH (ref 4.0–10.5)
nRBC: 0 % (ref 0.0–0.2)

## 2022-02-04 LAB — TROPONIN I (HIGH SENSITIVITY)
Troponin I (High Sensitivity): 446 ng/L (ref <18)
Troponin I (High Sensitivity): 738 ng/L (ref <18)
Troponin I (High Sensitivity): 1028 ng/L (ref <18)
Troponin I (High Sensitivity): 1585 ng/L (ref <18)
Troponin I (High Sensitivity): 1988 ng/L (ref <18)

## 2022-02-04 LAB — URINALYSIS, ROUTINE W REFLEX MICROSCOPIC
Bilirubin Urine: NEGATIVE
Glucose, UA: NEGATIVE mg/dL
Ketones, ur: 5 mg/dL — AB
Leukocytes,Ua: NEGATIVE
Nitrite: NEGATIVE
Protein, ur: 30 mg/dL — AB
Specific Gravity, Urine: 1.019 (ref 1.005–1.030)
pH: 5 (ref 5.0–8.0)

## 2022-02-04 LAB — RESP PANEL BY RT-PCR (FLU A&B, COVID) ARPGX2
Influenza A by PCR: NEGATIVE
Influenza B by PCR: NEGATIVE
SARS Coronavirus 2 by RT PCR: NEGATIVE

## 2022-02-04 LAB — MAGNESIUM: Magnesium: 1.7 mg/dL (ref 1.7–2.4)

## 2022-02-04 LAB — CK: Total CK: 2840 U/L — ABNORMAL HIGH (ref 49–397)

## 2022-02-04 MED ORDER — POLYETHYLENE GLYCOL 3350 17 G PO PACK
17.0000 g | PACK | Freq: Every day | ORAL | Status: DC
Start: 2022-02-04 — End: 2022-02-07
  Administered 2022-02-04 – 2022-02-05 (×2): 17 g via ORAL
  Filled 2022-02-04 (×2): qty 1

## 2022-02-04 MED ORDER — ACETAMINOPHEN 325 MG PO TABS: 650.0000 mg | ORAL_TABLET | Freq: Four times a day (QID) | ORAL | Status: DC | PRN

## 2022-02-04 MED ORDER — ENOXAPARIN SODIUM 40 MG/0.4ML IJ SOSY
40.0000 mg | PREFILLED_SYRINGE | INTRAMUSCULAR | Status: DC
  Administered 2022-02-04 – 2022-02-05 (×2): 40 mg via SUBCUTANEOUS
  Filled 2022-02-04 (×2): qty 0.4

## 2022-02-04 MED ORDER — ACETAMINOPHEN 650 MG RE SUPP: 650.0000 mg | Freq: Four times a day (QID) | RECTAL | Status: DC | PRN

## 2022-02-04 MED ORDER — MAGNESIUM SULFATE 2 GM/50ML IV SOLN
2.0000 g | Freq: Once | INTRAVENOUS | Status: AC
  Administered 2022-02-04: 2 g via INTRAVENOUS
  Filled 2022-02-04: qty 50

## 2022-02-04 MED ORDER — ONDANSETRON HCL 4 MG/2ML IJ SOLN: 4.0000 mg | Freq: Four times a day (QID) | INTRAMUSCULAR | Status: DC | PRN

## 2022-02-04 MED ORDER — ADULT MULTIVITAMIN W/MINERALS CH
1.0000 | ORAL_TABLET | Freq: Every day | ORAL | Status: DC
  Administered 2022-02-04 – 2022-02-06 (×3): 1 via ORAL
  Filled 2022-02-04 (×3): qty 1

## 2022-02-04 MED ORDER — PRAVASTATIN SODIUM 20 MG PO TABS
20.0000 mg | ORAL_TABLET | Freq: Every day | ORAL | Status: DC
  Administered 2022-02-04 – 2022-02-05 (×2): 20 mg via ORAL
  Filled 2022-02-04 (×2): qty 1

## 2022-02-04 MED ORDER — SODIUM CHLORIDE 0.9 % IV SOLN: INTRAVENOUS | Status: DC

## 2022-02-04 MED ORDER — ASPIRIN EC 81 MG PO TBEC
81.0000 mg | DELAYED_RELEASE_TABLET | ORAL | Status: DC
  Administered 2022-02-05: 81 mg via ORAL
  Filled 2022-02-04: qty 1

## 2022-02-04 MED ORDER — ONDANSETRON HCL 4 MG PO TABS: 4.0000 mg | ORAL_TABLET | Freq: Four times a day (QID) | ORAL | Status: DC | PRN

## 2022-02-04 MED ORDER — AMLODIPINE BESYLATE 5 MG PO TABS
2.5000 mg | ORAL_TABLET | Freq: Every day | ORAL | Status: DC
Start: 2022-02-05 — End: 2022-02-07
  Administered 2022-02-05 – 2022-02-06 (×2): 2.5 mg via ORAL
  Filled 2022-02-04 (×2): qty 1

## 2022-02-04 MED ORDER — SODIUM CHLORIDE 0.9 % IV BOLUS
500.0000 mL | Freq: Once | INTRAVENOUS | Status: AC
  Administered 2022-02-04: 500 mL via INTRAVENOUS

## 2022-02-04 MED ORDER — ACETAMINOPHEN 500 MG PO TABS
1000.0000 mg | ORAL_TABLET | Freq: Once | ORAL | Status: AC
  Administered 2022-02-04: 1000 mg via ORAL
  Filled 2022-02-04: qty 2

## 2022-02-04 MED ORDER — POTASSIUM CHLORIDE CRYS ER 20 MEQ PO TBCR
40.0000 meq | EXTENDED_RELEASE_TABLET | Freq: Once | ORAL | Status: AC
  Administered 2022-02-04: 40 meq via ORAL
  Filled 2022-02-04: qty 2

## 2022-02-04 MED ORDER — ASPIRIN 81 MG PO CHEW
324.0000 mg | CHEWABLE_TABLET | Freq: Once | ORAL | Status: AC
Start: 2022-02-04 — End: 2022-02-04
  Administered 2022-02-04: 324 mg via ORAL
  Filled 2022-02-04: qty 4

## 2022-02-04 NOTE — ED Notes (Signed)
Pt assisted to bedside toilet by this RN and Psychiatric nurse.  ?

## 2022-02-04 NOTE — Assessment & Plan Note (Signed)
Blood pressure is stable ?Continue amlodipine ?Discontinue Hyzaar ?

## 2022-02-04 NOTE — Assessment & Plan Note (Signed)
Secondary to weakness ?Place patient on fall precautions ?PT evaluation ?

## 2022-02-04 NOTE — ED Provider Notes (Signed)
? ?[facility] ?Provider Note ? ? ? Event Date/Time  ? First MD Initiated Contact with Patient 02/04/22 [phone]   ?  (approximate) ? ? ?History  ? ?Fall ? ? ?HPI ? ?Benjamin Walls is a 88 y.o. male with hypertension who comes in with concerns for a fall.  Patient reports he got up around 3 AM and he went to the bathroom and then when he came back that he lost his balance and fell down to the ground.  Does not think he hit his head he is not on any blood thinners.  He does report some increasing weakness and decreased appetite for 2 weeks where he just has not felt hungry.  He states that the food just does not have any taste.  He states that today after he fell he could not get back up but he was crawling around the ground until his wife found him and called EMS.  He reports that he [place]  He denies any chest pain, shortness of breath.  Tdap updated in 2022 ? ? ? ?Physical Exam  ? ?Triage Vital Signs: ?ED Triage Vitals  ?Enc Vitals Group  ?   BP 02/04/22 0924 108/77  ?   Pulse Rate 02/04/22 0924 91  ?   Resp 02/04/22 0924 17  ?   Temp 02/04/22 0927 98.2 ?F (36.8 ?C)  ?   Temp Source 02/04/22 0927 Oral  ?   SpO2 02/04/22 0924 95 %  ?   Weight 02/04/22 0925 163 lb 2.3 oz (74 kg)  ?   Height 02/04/22 0925 '6\' 1"'$  (1.854 m)  ?   Head Circumference --   ?   Peak Flow --   ?   Pain Score 02/04/22 0925 0  ?   Pain Loc --   ?   Pain Edu? --   ?   Excl. in [place]? --   ? ? ?Most recent vital signs: ?Vitals:  ? 02/04/22 0924 02/04/22 0927  ?BP: 108/77   ?Pulse: 91   ?Resp: 17   ?Temp:  98.2 ?F (36.8 ?C)  ?SpO2: 95%   ? ? ? ?General: Awake, no distress.  Alert and oriented x4 ?CV:  Good peripheral perfusion.  No chest wall tenderness ?Resp:  Normal effort.  ?Abd:  No distention.  Soft and nontender ?Other:  No tenderness noted in any of his extremities.  Able to lift both legs fully up off the bed and bend his knees without any issues.  No CTL spine tenderness.  He is  got some abrasions on his toes which she states was from the wheelchair from EMS.  He denies any other injuries or pain of these areas ? ? ?ED Results / Procedures / Treatments  ? ?Labs ?(all labs ordered are listed, but only abnormal results are displayed) ?Labs Reviewed  ?RESP PANEL BY RT-PCR (FLU A&B, COVID) [id number]  ?CBC WITH DIFFERENTIAL/PLATELET  ?COMPREHENSIVE METABOLIC PANEL  ?URINALYSIS, ROUTINE W REFLEX MICROSCOPIC  ?CK  ?MAGNESIUM  ?TROPONIN I (HIGH SENSITIVITY)  ? ? ? ?EKG ? ?My interpretation of EKG: ? ?Normal sinus rate of 95 without any ST elevation or T wave inversions but does have an occasional PVC ? ? ? ?RADIOLOGY ?I have reviewed the xray personally no evidence of pneumonia or pulmonary edema ? ?CT head also personally reviewed without evidence of intercranial hemorrhage.  CT cervical pending radiology read ? ?IMPRESSION: ?1. No evidence of acute intracranial  abnormality. Atrophy and ?probable chronic small-vessel white matter ischemic changes. ?2. No static evidence of acute injury to the cervical spine. ?Degenerative changes as described. ?3. 6 mm RIGHT apical pulmonary nodule. Non-contrast chest CT at 6-12 ?months is recommended. If the nodule is stable at time of repeat CT, ?then future CT at 18-24 months (from today's scan) is considered ?optional for low-risk patients, but is recommended for high-risk ?patients. This recommendation follows the consensus statement: ?Guidelines for Management of Incidental Pulmonary Nodules Detected ?on CT Images: From the Fleischner Society 2017; Radiology 2017; ?284:228-243. ?4. 2 cm RIGHT thyroid nodule. Recommend thyroid US. (Ref: J Am Coll ?Radiol. 2015 Feb;12(2): 143-50). ? ?PROCEDURES: ? ?Critical Care performed: No ? ?.1-3 Lead EKG Interpretation ?Performed by: Vanessa Wh[place], MD ?Authorized by: Vanessa [place], MD  ? ?  Interpretation: normal   ?  ECG rate:  90 ?  ECG rate assessment: normal   ?  Rhythm: sinus rhythm   ?  Ectopy: none   ?  Conduction:  normal   ? ? ?MEDICATIONS ORDERED IN ED: ?Medications  ?sodium chloride 0.9 % bolus 500 mL (0 mLs Intravenous Stopped 02/04/22 1040)  ?acetaminophen (TYLENOL) tablet 1,000 mg (1,000 mg Oral Given 02/04/22 0959)  ?aspirin chewable tablet 324 mg (324 mg Oral Given 02/04/22 1124)  ? ? ? ?IMPRESSION / MDM / ASSESSMENT AND PLAN / ED COURSE  ?I reviewed the triage vital signs and the nursing notes. ? ? ?Differential diagnosis includes, but is not limited to, viral secondary to dehydration, Electra abnormalities, rhabdo, ACS.  Will get chest x-ray to make sure no evidence of pneumonia, mass that could be contributing to weakness.  Will get CT head due to loss of balance earlier to make sure no evidence of intercranial hemorrhage, mass.  CT cervical evaluate for cervical fracture given patient's age. ? ?Incidental findings on CT scan were discussed with patient and family to get outpatient follow-up.  He expressed understanding ? ?CBC shows elevated white count but otherwise does not have any infectious symptoms. ?CMP shows elevated calcium at 13.4.  This could be contributing to patient's weakness.  Patient already has fluids going in ?Troponin was elevated at 446.  Denies any chest pain or shortness of breath.  Suspect most likely demand in nature.  EKG without evidence of ischemia.  We will give patient aspirin and continue to trend out but will hold off on heparin at this time given unlikely to be active ACS ? ?COVID, flu are negative ?CK elevated but patient is getting fluids ? ? ?I reviewed patient's echocardiogram from January 2023 where patient had normal heart and valve function but had a murmur secondary to some aortic valve calcium.  Patient was ordered a ZIO monitor.  Patient's monitor showed some episodes of atrial tachycardia and he was started on mag oxide once every day as scheduled for a follow-up in 3 weeks.  This was a note placed by his primary doctor on 11/30/2021 ? ?The patient is on the cardiac monitor to  evaluate for evidence of arrhythmia and/or significant heart rate changes. ? ? ? ?FINAL CLINICAL IMPRESSION(S) / ED DIAGNOSES  ? ?Final diagnoses:  ?Hypercalcemia  ?Fall, initial encounter  ?Weakness  ? ? ? ?Rx / DC Orders  ? ?ED Discharge Orders   ? ? None  ? ?  ? ? ? ?Note:  This document was prepared using Dragon voice recognition software and may include unintentional dictation errors. ?  ?Vanessa [place], MD ?02/04/22 1139 ? ?

## 2022-02-04 NOTE — ED Notes (Signed)
Provider Agbata notified via secure chat that pt's trop inc from 400s to [place]. Pt continues to deny pain.  ?

## 2022-02-04 NOTE — Consult Note (Signed)
? ? ?BRENN Walls is a 88 y.o. male  [id number] ? ?Primary Cardiologist: Neoma Laming, MD ?Reason for Consultation: elevated troponins ? ?HPI: Benjamin Walls is a 88 y.o. male with medical history significant for hypertension and dyslipidemia who presents to the ER for evaluation after an unwitnessed fall at about 3 AM this morning. Patient states that he has been weak for several weeks and has had very poor oral intake.  He got out of bed at about 3 AM to use the bathroom and felt dizzy and slid to the floor.  He was unable to get up and he remained on the floor for several hours before his wife found him.  He denies hitting his head or loss of consciousness. ?His oral intake has been very poor and he also complains of constipation.  He denies having any nausea or vomiting. ?He denies having any chest pain, no shortness of breath, no abdominal pain, no urinary symptoms, no fever, no chills, no cough, no leg swelling, no blurred vision or any focal deficits. ? ?Patient reports no known history of atherosclerotic heart disease. Patient states his weakness and poor oral intake started a couple weeks ago after he was started on magnesium oxide per his PCP.  ? ? ?Review of Systems:  ? ? ?Past Medical History:  ?Diagnosis Date  ? Hyperlipidemia   ? Hypertension   ? ? ?(Not in a hospital admission) ? ? ? ? [START ON 02/05/2022] amLODipine  2.5 mg Oral Daily  ? [START ON 02/05/2022] aspirin EC  81 mg Oral QODAY  ? enoxaparin (LOVENOX) injection  40 mg Subcutaneous Q24H  ? multivitamin with minerals  1 tablet Oral Daily  ? polyethylene glycol  17 g Oral Daily  ? potassium chloride  40 mEq Oral Once  ? pravastatin  20 mg Oral q1800  ? ? ?Infusions: ? sodium chloride 100 mL/hr at 02/04/22 1347  ? magnesium sulfate bolus IVPB    ? ? ?Allergies  ?Allergen Reactions  ? Lotensin  [Benazepril Hcl]   ? Penicillins   ? ? ?Social History  ? ?Socioeconomic History  ? Marital status: Married  ?  Spouse name: Not on file  ? Number of  children: 2  ? Years of education: Not on file  ? Highest education level: Bachelor's degree (e.g., BA, AB, BS)  ?Occupational History  ? Occupation: Tour manager in schools in [place]  ?  Comment: Works at [employer]  ?  Comment: not cuurently working due to Covid19  ?Tobacco Use  ? Smoking status: Former  ?  Types: Cigarettes  ?  Quit date: 11/06/1962  ?  Years since quitting: 59.2  ? Smokeless tobacco: Never  ?Vaping Use  ? Vaping Use: Never used  ?Substance and Sexual Activity  ? Alcohol use: Yes  ?  Alcohol/week: 2.0 - 3.0 standard drinks  ?  Types: 2 - 3 Glasses of wine per week  ? Drug use: No  ? Sexual activity: Not on file  ?Other Topics Concern  ? Not on file  ?Social History Narrative  ? Not on file  ? ?Social Determinants of Health  ? ?Financial Resource Strain: Not on file  ?Food Insecurity: Not on file  ?Transportation Needs: Not on file  ?Physical Activity: Not on file  ?Stress: Not on file  ?Social Connections: Not on file  ?Intimate Partner Violence: Not on file  ? ? ?Family History  ?Problem Relation Age of Onset  ? Hypertension Mother   ?  Heart Problems Father   ? ? ?PHYSICAL EXAM: ?Vitals:  ? 02/04/22 1330 02/04/22 1345  ?BP: 124/66   ?Pulse: 65   ?Resp: 17   ?Temp:    ?SpO2: 95% 98%  ? ? ?No intake or output data in the 24 hours ending 02/04/22 1529 ? ?General:  Well appearing. No respiratory difficulty ?HEENT: normal ?Neck: supple. no JVD. Carotids 2+ bilat; no bruits. No lymphadenopathy or thryomegaly appreciated. ?Cor: PMI nondisplaced. Regular rate & rhythm. No rubs, gallops or murmurs. ?Lungs: clear ?Abdomen: soft, nontender, nondistended. No hepatosplenomegaly. No bruits or masses. Good bowel sounds. ?Extremities: no cyanosis, clubbing, rash, edema ?Neuro: alert & oriented x 3, cranial nerves grossly intact. moves all 4 extremities w/o difficulty. Affect pleasant. ? ?ECG: NSR with PVCs, HR 95 bpm, prolonged QT ? ?Results for orders placed or performed during the hospital  encounter of 02/04/22 (from the past 24 hour(s))  ?CBC with Differential     Status: Abnormal  ? Collection Time: 02/04/22  9:30 AM  ?Result Value Ref Range  ? WBC 13.1 (H) 4.0 - 10.5 K/uL  ? RBC 4.22 4.22 - 5.81 MIL/uL  ? Hemoglobin 12.8 (L) 13.0 - 17.0 g/dL  ? HCT 37.2 (L) 39.0 - 52.0 %  ? MCV 88.2 80.0 - 100.0 fL  ? MCH 30.3 26.0 - 34.0 pg  ? MCHC 34.4 30.0 - 36.0 g/dL  ? RDW 12.9 11.5 - 15.5 %  ? Platelets 190 150 - 400 K/uL  ? nRBC 0.0 0.0 - 0.2 %  ? Neutrophils Relative % 89 %  ? Neutro Abs 11.6 (H) 1.7 - 7.7 K/uL  ? Lymphocytes Relative 3 %  ? Lymphs Abs 0.4 (L) 0.7 - 4.0 K/uL  ? Monocytes Relative 8 %  ? Monocytes Absolute 1.0 0.1 - 1.0 K/uL  ? Eosinophils Relative 0 %  ? Eosinophils Absolute 0.0 0.0 - 0.5 K/uL  ? Basophils Relative 0 %  ? Basophils Absolute 0.0 0.0 - 0.1 K/uL  ? Immature Granulocytes 0 %  ? Abs Immature Granulocytes 0.05 0.00 - 0.07 K/uL  ?Comprehensive metabolic panel     Status: Abnormal  ? Collection Time: 02/04/22  9:30 AM  ?Result Value Ref Range  ? Sodium 140 135 - 145 mmol/L  ? Potassium 3.1 (L) 3.5 - 5.1 mmol/L  ? Chloride 100 98 - 111 mmol/L  ? CO2 30 22 - 32 mmol/L  ? Glucose, Bld 111 (H) 70 - 99 mg/dL  ? BUN 53 (H) 8 - 23 mg/dL  ? Creatinine, Ser 1.18 0.61 - 1.24 mg/dL  ? Calcium 13.4 (HH) 8.9 - 10.3 mg/dL  ? Total Protein 6.7 6.5 - 8.1 g/dL  ? Albumin 4.0 3.5 - 5.0 g/dL  ? AST 118 (H) 15 - 41 U/L  ? ALT 41 0 - 44 U/L  ? Alkaline Phosphatase 90 38 - 126 U/L  ? Total Bilirubin 1.3 (H) 0.3 - 1.2 mg/dL  ? GFR, Estimated 59 (L) >60 mL/min  ? Anion gap 10 5 - 15  ?Troponin I (High Sensitivity)     Status: Abnormal  ? Collection Time: 02/04/22  9:30 AM  ?Result Value Ref Range  ? Troponin I (High Sensitivity) 446 (HH) <18 ng/L  ?Resp Panel by RT-PCR (Flu A&B, Covid) Nasopharyngeal Swab     Status: None  ? Collection Time: 02/04/22  9:30 AM  ? Specimen: Nasopharyngeal Swab; Nasopharyngeal(NP) swabs in vial transport medium  ?Result Value Ref Range  ? SARS Coronavirus 2 by RT PCR  NEGATIVE NEGATIVE  ? Influenza A by PCR NEGATIVE NEGATIVE  ? Influenza B by PCR NEGATIVE NEGATIVE  ?CK     Status: Abnormal  ? Collection Time: 02/04/22  9:30 AM  ?Result Value Ref Range  ? Total CK 2,840 (H) 49 - 397 U/L  ?Magnesium     Status: None  ? Collection Time: 02/04/22  9:30 AM  ?Result Value Ref Range  ? Magnesium 1.7 1.7 - 2.4 mg/dL  ?Urinalysis, Routine w reflex microscopic     Status: Abnormal  ? Collection Time: 02/04/22 11:30 AM  ?Result Value Ref Range  ? Color, Urine YELLOW (A) YELLOW  ? APPearance CLEAR (A) CLEAR  ? Specific Gravity, Urine 1.019 1.005 - 1.030  ? pH 5.0 5.0 - 8.0  ? Glucose, UA NEGATIVE NEGATIVE mg/dL  ? Hgb urine dipstick SMALL (A) NEGATIVE  ? Bilirubin Urine NEGATIVE NEGATIVE  ? Ketones, ur 5 (A) NEGATIVE mg/dL  ? Protein, ur 30 (A) NEGATIVE mg/dL  ? Nitrite NEGATIVE NEGATIVE  ? Leukocytes,Ua NEGATIVE NEGATIVE  ? RBC / HPF 0-5 0 - 5 RBC/hpf  ? WBC, UA 0-5 0 - 5 WBC/hpf  ? Bacteria, UA RARE (A) NONE SEEN  ? Squamous Epithelial / LPF 0-5 0 - 5  ? Mucus PRESENT   ? Hyaline Casts, UA PRESENT   ?Troponin I (High Sensitivity)     Status: Abnormal  ? Collection Time: 02/04/22 11:30 AM  ?Result Value Ref Range  ? Troponin I (High Sensitivity) 738 (HH) <18 ng/L  ?Troponin I (High Sensitivity)     Status: Abnormal  ? Collection Time: 02/04/22  1:39 PM  ?Result Value Ref Range  ? Troponin I (High Sensitivity) 1,028 (HH) <18 ng/L  ? ?DG Chest 2 View ? ?Result Date: 02/04/2022 ?CLINICAL DATA:  Trauma, fall, generalized weakness EXAM: CHEST - 2 VIEW COMPARISON:  None. FINDINGS: The heart size and mediastinal contours are within normal limits. Both lungs are clear. The visualized skeletal structures are unremarkable. IMPRESSION: No active cardiopulmonary disease. Electronically Signed   By: Elmer Picker M.D.   On: 02/04/2022 10:32  ? ?CT HEAD WO CONTRAST (5MM) ? ?Result Date: 02/04/2022 ?CLINICAL DATA:  88 year old male with head and neck pain following fall. Initial encounter. EXAM: CT HEAD  WITHOUT CONTRAST CT CERVICAL SPINE WITHOUT CONTRAST TECHNIQUE: Multidetector CT imaging of the head and cervical spine was performed following the standard protocol without intravenous contrast. Multiplanar CT

## 2022-02-04 NOTE — Assessment & Plan Note (Signed)
Related to hydrochlorothiazide use ?Supplement potassium and magnesium ?

## 2022-02-04 NOTE — Assessment & Plan Note (Signed)
Related to poor oral intake with concomitant diuretic use ?Patient's baseline serum creatinine is 0.85 and today on admission it is 1.18 with elevated BUN levels ?Hold HCTZ ?Continue IV fluid hydration ?Repeat renal parameters in a.m. ?

## 2022-02-04 NOTE — Assessment & Plan Note (Signed)
Patient noted to have elevated troponin levels ?He has a history of coronary artery disease but denies having any chest pain or shortness of breath ?Elevated troponin levels may be related to rhabdomyolysis ?Continue to trend troponin levels ?Continue aspirin and statins ?Obtain 2D echocardiogram to rule out regional wall motion abnormality ?Consult cardiology ?

## 2022-02-04 NOTE — ED Notes (Signed)
Pt given drink with verbal okay from [place]. Visitors remain at bedside. Call bell within reach. Stretcher locked low. Rail up.  ?

## 2022-02-04 NOTE — Assessment & Plan Note (Signed)
Most likely iatrogenic/medication induced ?Patient is on hydrochlorothiazide which will be discontinued ?IV fluid hydration and will add furosemide ?Repeat calcium levels in a.m. ?

## 2022-02-04 NOTE — ED Triage Notes (Signed)
Pt to ED via [facility] from home. Pt sustained an unwitnessed fall this morning around 3am and was found by wife a few hours later. Pt A&Ox4. Pt family states pt has had increased weakness and decreased appetite x2 week. Pt denies blood thinners, head trauma or LOC.  ?

## 2022-02-04 NOTE — ED Notes (Signed)
Provider at bedside talking with pt. Pt calm, resp reg/unlabored, skin dry.  ?

## 2022-02-04 NOTE — H&P (Signed)
?History and Physical  ? ? ?Patient: Benjamin Walls KGM:[id number] DOB: [birth year] ?DOA: 02/04/2022 ?DOS: the patient was seen and examined on 02/04/2022 ?PCP: Birdie Sons, MD  ?Patient coming from: Home ? ?Chief Complaint:  ?Chief Complaint  ?Patient presents with  ? Fall  ? ?HPI: Benjamin Walls is a 88 y.o. male with medical history significant for coronary artery disease, hypertension and dyslipidemia who presents to the ER for evaluation after an unwitnessed fall at about 3 AM this morning. ?Patient states that he has been weak for several weeks and has had very poor oral intake.  He got out of bed at about 3 AM to use the bathroom and felt dizzy and slid to the floor.  He was unable to get up and he remained on the floor for several hours before his wife found him.  He denies hitting his head or loss of consciousness. ?His oral intake has been very poor and he also complains of constipation.  He denies having any nausea or vomiting. ?He denies having any chest pain, no shortness of breath, no abdominal pain, no urinary symptoms, no fever, no chills, no cough, no leg swelling, no blurred vision or any focal deficits. ? ?Review of Systems: As mentioned in the history of present illness. All other systems reviewed and are negative. ?Past Medical History:  ?Diagnosis Date  ? Hyperlipidemia   ? Hypertension   ? ?Past Surgical History:  ?Procedure Laterality Date  ? CATARACT EXTRACTION  10/09/2012 and 04/2013  ? ERCP  07/27/2009  ? Impression: Prescence of pancreatic stent. This was found to be patent. -Cholendocholithiasis was found. Complete removal was accomplished by billiary sphincterotomy.- Dilated CBD with single large stone, which was extracted with ballone.   ? Exercise Stress test  05/06/1999  ? PTCA  06/1999  ? STENT  ? TONSILLECTOMY AND ADENOIDECTOMY    ? ?Social History:  reports that he quit smoking about 59 years ago. His smoking use included cigarettes. He has never used smokeless tobacco. He  reports current alcohol use of about 2.0 - 3.0 standard drinks per week. He reports that he does not use drugs. ? ?Allergies  ?Allergen Reactions  ? Lotensin  [Benazepril Hcl]   ? Penicillins   ? ? ?Family History  ?Problem Relation Age of Onset  ? Hypertension Mother   ? Heart Problems Father   ? ? ?Prior to Admission medications   ?Medication Sig Start Date End Date Taking? Authorizing Provider  ?amLODipine (NORVASC) 2.5 MG tablet TAKE 1 TABLET(2.5 MG) BY MOUTH DAILY 08/02/21  Yes Birdie Sons, MD  ?aspirin 81 MG tablet Take 1 tablet by mouth every other day.    Yes [provider]  ?co-enzyme Q-10 30 MG capsule Take 30 mg by mouth daily.   Yes [provider]  ?Emollient (OKEEFFES WORKING HANDS EX) Apply topically daily.   Yes [provider]  ?losartan-hydrochlorothiazide (HYZAAR) 100-25 MG tablet TAKE 1 TABLET BY MOUTH DAILY 09/08/21  Yes Birdie Sons, MD  ?lovastatin (MEVACOR) 20 MG tablet TAKE 1 TABLET(20 MG) BY MOUTH AT BEDTIME 10/31/21  Yes Birdie Sons, MD  ?Multiple Vitamin (MULTIVITAMIN PO) Take by mouth.   Yes [provider]  ?triamcinolone cream (KENALOG) 0.1 % APPLY EXTERNALLY TO THE AFFECTED AREA TWICE DAILY ?Patient not taking: Reported on 02/04/2022 05/19/19   Birdie Sons, MD  ? ? ?Physical Exam: ?Vitals:  ? 02/04/22 1126 02/04/22 1200 02/04/22 1230 02/04/22 1300  ?BP: 123/66 126/69 122/70  131/67  ?Pulse: 77 72 73 67  ?Resp: '20 14 19 16  '$ ?Temp:      ?TempSrc:      ?SpO2: 96% 97% 97% 100%  ?Weight:      ?Height:      ? ?Physical Exam ?Vitals and nursing note reviewed.  ?Constitutional:   ?   Appearance: Normal appearance. He is normal weight.  ?HENT:  ?   Head: Normocephalic and atraumatic.  ?   Nose: Nose normal.  ?   Mouth/Throat:  ?   Mouth: Mucous membranes are dry.  ?Eyes:  ?   Conjunctiva/sclera: Conjunctivae normal.  ?Cardiovascular:  ?   Rate and Rhythm: Normal rate and regular rhythm.  ?   Heart sounds: Murmur heard.  ?   Comments: Systolic  ejection ?Pulmonary:  ?   Effort: Pulmonary effort is normal.  ?   Breath sounds: Normal breath sounds.  ?Abdominal:  ?   General: Abdomen is flat. Bowel sounds are normal.  ?   Palpations: Abdomen is soft.  ?Musculoskeletal:     ?   General: Normal range of motion.  ?   Cervical back: Normal range of motion and neck supple.  ?Skin: ?   General: Skin is warm and dry.  ?Neurological:  ?   General: No focal deficit present.  ?   Mental Status: He is alert and oriented to person, place, and time.  ?   Motor: Weakness present.  ?Psychiatric:     ?   Mood and Affect: Mood normal.     ?   Behavior: Behavior normal.  ? ? ?Data Reviewed: ?Relevant notes from primary care and specialist visits, past discharge summaries as available in EHR, including Care Everywhere. ?Prior diagnostic testing as pertinent to current admission diagnoses ?Updated medications and problem lists for reconciliation ?ED course, including vitals, labs, imaging, treatment and response to treatment ?Triage notes, nursing and pharmacy notes and ED provider's notes ?Notable results as noted in HPI ?Labs reviewed.  White count 13.1, hemoglobin 12.8, hematocrit 37.2, platelet count 190, magnesium 1.7, total CK2 840, troponin 446 >> 738, potassium 3.1, glucose 111, BUN 53, creatinine 1.18 compared to baseline of 0.85, calcium 13.4, AST 118, total bili 1.3 ?Cervical spine CT/head CT shows no evidence of acute intracranial abnormality. Atrophy and probable chronic small-vessel white matter ischemic changes. No static evidence of acute injury to the cervical spine. ?Degenerative changes as described. 6 mm RIGHT apical pulmonary nodule. Non-contrast chest CT at 6-12 ?months is recommended.  ?Chest x-ray shows no evidence of acute cardiopulmonary disease ?Twelve-lead EKG reviewed by me shows sinus rhythm with PVCs.  Prolonged QT interval and low voltage QRS. ?There are no new results to review at this time. ? ? ?Assessment and Plan: ?* Hypercalcemia ?Most likely  iatrogenic/medication induced ?Patient is on hydrochlorothiazide which will be discontinued ?IV fluid hydration and will add furosemide ?Repeat calcium levels in a.m. ? ?Rhabdomyolysis ?Traumatic and following a fall ?Judicious IV fluid resuscitation ?Repeat CK levels in a.m. ? ?Elevated troponin ?Patient noted to have elevated troponin levels ?He has a history of coronary artery disease but denies having any chest pain or shortness of breath ?Elevated troponin levels may be related to rhabdomyolysis ?Continue to trend troponin levels ?Continue aspirin and statins ?Obtain 2D echocardiogram to rule out regional wall motion abnormality ?Consult cardiology ? ?Fall ?Secondary to weakness ?Place patient on fall precautions ?PT evaluation ? ?Dehydration ?Related to poor oral intake with concomitant diuretic use ?Patient's baseline serum creatinine is  0.85 and today on admission it is 1.18 with elevated BUN levels ?Hold HCTZ ?Continue IV fluid hydration ?Repeat renal parameters in a.m. ? ?Hypokalemia ?Related to hydrochlorothiazide use ?Supplement potassium and magnesium ? ?Hypertension ?Blood pressure is stable ?Continue amlodipine ?Discontinue Hyzaar ? ? ? ? ? Advance Care Planning:   Code Status: DNR  ? ?Consults: Cardiology ? ?Family Communication: Greater than 50% of time was spent discussing patient's condition and plan of care with him and his family at the bedside.  CODE STATUS was discussed and he is a DO NOT RESUSCITATE. ? ? ?Severity of Illness: ?The appropriate patient status for this patient is INPATIENT. Inpatient status is judged to be reasonable and necessary in order to provide the required intensity of service to ensure the patient's safety. The patient's presenting symptoms, physical exam findings, and initial radiographic and laboratory data in the context of their chronic comorbidities is felt to place them at high risk for further clinical deterioration. Furthermore, it is not anticipated that the  patient will be medically stable for discharge from the hospital within 2 midnights of admission.  ? ?* I certify that at the point of admission it is my clinical judgment that the patient will require inpatie

## 2022-02-04 NOTE — ED Notes (Signed)
Pt transported to CT ?

## 2022-02-04 NOTE — Assessment & Plan Note (Signed)
Traumatic and following a fall ?Judicious IV fluid resuscitation ?Repeat CK levels in a.m. ?

## 2022-02-04 NOTE — ED Notes (Signed)
Provider Agbata notified via secure chat that trop inc to 1028. Will collect again at 2 hour mark.  ?

## 2022-02-05 ENCOUNTER — Inpatient Hospital Stay: Payer: Medicare Other

## 2022-02-05 ENCOUNTER — Encounter: Payer: Self-pay | Admitting: Internal Medicine

## 2022-02-05 LAB — CBC
HCT: 33.6 % — ABNORMAL LOW (ref 39.0–52.0)
Hemoglobin: 11.8 g/dL — ABNORMAL LOW (ref 13.0–17.0)
MCH: 30.6 pg (ref 26.0–34.0)
MCHC: 35.1 g/dL (ref 30.0–36.0)
MCV: 87.3 fL (ref 80.0–100.0)
Platelets: 142 10*3/uL — ABNORMAL LOW (ref 150–400)
RBC: 3.85 MIL/uL — ABNORMAL LOW (ref 4.22–5.81)
RDW: 13.1 % (ref 11.5–15.5)
WBC: 7.9 10*3/uL (ref 4.0–10.5)
nRBC: 0 % (ref 0.0–0.2)

## 2022-02-05 LAB — CK: Total CK: 2576 U/L — ABNORMAL HIGH (ref 49–397)

## 2022-02-05 LAB — PHOSPHORUS: Phosphorus: 2.3 mg/dL — ABNORMAL LOW (ref 2.5–4.6)

## 2022-02-05 LAB — BASIC METABOLIC PANEL
Anion gap: 7 (ref 5–15)
BUN: 52 mg/dL — ABNORMAL HIGH (ref 8–23)
CO2: 27 mmol/L (ref 22–32)
Calcium: 12.1 mg/dL — ABNORMAL HIGH (ref 8.9–10.3)
Chloride: 105 mmol/L (ref 98–111)
Creatinine, Ser: 0.88 mg/dL (ref 0.61–1.24)
GFR, Estimated: >60 mL/min (ref >60)
Glucose, Bld: 87 mg/dL (ref 70–99)
Potassium: 3.2 mmol/L — ABNORMAL LOW (ref 3.5–5.1)
Sodium: 139 mmol/L (ref 135–145)

## 2022-02-05 LAB — MAGNESIUM: Magnesium: 2 mg/dL (ref 1.7–2.4)

## 2022-02-05 LAB — TROPONIN I (HIGH SENSITIVITY): Troponin I (High Sensitivity): 2181 ng/L (ref <18)

## 2022-02-05 MED ORDER — POTASSIUM CHLORIDE CRYS ER 20 MEQ PO TBCR
40.0000 meq | EXTENDED_RELEASE_TABLET | Freq: Once | ORAL | Status: AC
  Administered 2022-02-05: 40 meq via ORAL
  Filled 2022-02-05: qty 2

## 2022-02-05 MED ORDER — POTASSIUM CHLORIDE IN NACL 20-0.9 MEQ/L-% IV SOLN
INTRAVENOUS | Status: DC
  Filled 2022-02-05 (×5): qty 1000

## 2022-02-05 NOTE — Progress Notes (Addendum)
? ? ? ?Progress Note  ? ? ?Benjamin Walls  VFI:[id number] DOB: [birth year]  DOA: 02/04/2022 ?PCP: Birdie Sons, MD  ? ? ? ? ?Brief Narrative:  ? ? ?Medical records reviewed and are as summarized below: ? ?Benjamin Walls is a 88 y.o. male with medical history significant for CAD, hypertension, dyslipidemia, who presented to the hospital after a fall at home.  He said he got out of bed to use the bathroom.  He felt dizzy, lost his balance and fell.  He has had poor oral intake and generalized weakness for several days.  He was unable to get up from the floor by himself. ? ? ? ? ? ? ? ? ?Assessment/Plan:  ? ?Principal Problem: ?  Hypercalcemia ?Active Problems: ?  Rhabdomyolysis ?  Elevated troponin ?  Hypertension ?  Hypokalemia ?  Dehydration ?  Fall ? ? ? ?Body mass index is 21.52 kg/m?. ? ?Traumatic rhabdomyolysis: CK is trending down.  Continue IV fluids. ? ?Hypercalcemia, dehydration: Calcium level is trending down.  Continue IV fluids.  HCTZ has been held. ? ?Elevated troponin: Troponins went from 446 to 2,181. Probably from rhabdomyolysis.  2D echo is pending.  Appreciate input from cardiologist. ? ?Generalized weakness, s/p fall: PT and OT evaluation. ? ?Hypokalemia, mild hypophosphatemia: Replete potassium and monitor levels.  Phosphorus level is suspected to improve with time. ? ?Thrombocytopenia: Monitor CBC. ? ?6 mm right apical lung nodule, 4.2 cm right thyroid nodule: Thyroid ultrasound has been ordered for further evaluation of thyroid nodule.  Outpatient follow-up with PCP recommended for monitoring of lung nodule. ? ?Other comorbidities include CAD, hypertension ? ?Diet Order   ? ?       ?  Diet 2 gram sodium Room service appropriate? Yes; Fluid consistency: Thin  Diet effective now       ?  ? ?  ?  ? ?  ? ? ? ? ? ? ? ? ?Consultants: ?Cardiologist ? ?Procedures: ?None ? ? ? ?Medications:  ? ? amLODipine  2.5 mg Oral Daily  ? aspirin EC  81 mg Oral QODAY  ? enoxaparin (LOVENOX) injection  40 mg  Subcutaneous Q24H  ? multivitamin with minerals  1 tablet Oral Daily  ? polyethylene glycol  17 g Oral Daily  ? potassium chloride  40 mEq Oral Once  ? pravastatin  20 mg Oral q1800  ? ?Continuous Infusions: ? sodium chloride 100 mL/hr at 02/05/22 0001  ? ? ? ?Anti-infectives (From admission, onward)  ? ? None  ? ?  ? ? ? ? ? ? ? ? ? ?Family Communication/Anticipated D/C date and plan/Code Status  ? ?DVT prophylaxis: enoxaparin (LOVENOX) injection 40 mg Start: 02/04/22 2200 ? ? ?  Code Status: DNR ? ?Family Communication: None ?Disposition Plan: Plan to discharge home in 1 to 2 days ? ? ?Status is: Inpatient ?Remains inpatient appropriate because: On IV fluids, generalized weakness ? ? ? ? ? ? ?Subjective:  ? ?Interval events noted.  She was a little better today but is still awake.  No chest pain or shortness of breath.  His nurse was at the bedside. ? ?Objective:  ? ? ?Vitals:  ? 02/04/22 2054 02/04/22 2109 02/05/22 0418 02/05/22 0751  ?BP:  (!) 150/57 (!) 142/67 136/62  ?Pulse:  69 67 65  ?Resp:  '16 16 18  '$ ?Temp: 98.3 ?F (36.8 ?C) 98 ?F (36.7 ?C) 98.1 ?F (36.7 ?C) (!) 97.4 ?F (36.3 ?C)  ?TempSrc: Oral Oral Oral   ?  SpO2:  97% 99% (!) 87%  ?Weight:      ?Height:      ? ?No data found. ? ? ?Intake/Output Summary (Last 24 hours) at 02/05/2022 0920 ?Last data filed at 02/05/2022 0459 ?Gross per 24 hour  ?Intake 861.95 ml  ?Output 400 ml  ?Net 461.95 ml  ? ?Filed Weights  ? 02/04/22 0925  ?Weight: 74 kg  ? ? ?Exam: ? ?GEN: NAD ?SKIN: No rash ?EYES: EOMI ?ENT: MMM ?CV: RRR ?PULM: CTA B ?ABD: soft, ND, NT, +BS ?CNS: AAO x 3, non focal ?EXT: No edema or tenderness ? ? ? ?  ? ? ?Data Reviewed:  ? ?I have personally reviewed following labs and imaging studies: ? ?Labs: ?Labs show the following:  ? ?Basic Metabolic Panel: ?Recent Labs  ?Lab 02/04/22 ?0930 02/05/22 ?0045  ?NA 140 139  ?K 3.1* 3.2*  ?CL 100 105  ?CO2 30 27  ?GLUCOSE 111* 87  ?BUN 53* 52*  ?CREATININE 1.18 0.88  ?CALCIUM 13.4* 12.1*  ?MG 1.7  --   ? ?GFR ?Estimated  Creatinine Clearance: 59.6 mL/min (by C-G formula based on SCr of 0.88 mg/dL). ?Liver Function Tests: ?Recent Labs  ?Lab 02/04/22 ?0930  ?AST 118*  ?ALT 41  ?ALKPHOS 90  ?BILITOT 1.3*  ?PROT 6.7  ?ALBUMIN 4.0  ? ?No results for input(s): LIPASE, AMYLASE in the last 168 hours. ?No results for input(s): AMMONIA in the last 168 hours. ?Coagulation profile ?No results for input(s): INR, PROTIME in the last 168 hours. ? ?CBC: ?Recent Labs  ?Lab 02/04/22 ?0930 02/05/22 ?0045  ?WBC 13.1* 7.9  ?NEUTROABS 11.6*  --   ?HGB 12.8* 11.8*  ?HCT 37.2* 33.6*  ?MCV 88.2 87.3  ?PLT 190 142*  ? ?Cardiac Enzymes: ?Recent Labs  ?Lab 02/04/22 ?0930 02/05/22 ?0045  ?CKTOTAL 2,840* 2,576*  ? ?BNP (last 3 results) ?No results for input(s): PROBNP in the last 8760 hours. ?CBG: ?No results for input(s): GLUCAP in the last 168 hours. ?D-Dimer: ?No results for input(s): DDIMER in the last 72 hours. ?Hgb A1c: ?No results for input(s): HGBA1C in the last 72 hours. ?Lipid Profile: ?No results for input(s): CHOL, HDL, LDLCALC, TRIG, CHOLHDL, LDLDIRECT in the last 72 hours. ?Thyroid function studies: ?No results for input(s): TSH, T4TOTAL, T3FREE, THYROIDAB in the last 72 hours. ? ?Invalid input(s): FREET3 ?Anemia work up: ?No results for input(s): VITAMINB12, FOLATE, FERRITIN, TIBC, IRON, RETICCTPCT in the last 72 hours. ?Sepsis Labs: ?Recent Labs  ?Lab 02/04/22 ?0930 02/05/22 ?0045  ?WBC 13.1* 7.9  ? ? ?Microbiology ?Recent Results (from the past 240 hour(s))  ?Resp Panel by RT-PCR (Flu A&B, Covid) Nasopharyngeal Swab     Status: None  ? Collection Time: 02/04/22  9:30 AM  ? Specimen: Nasopharyngeal Swab; Nasopharyngeal(NP) swabs in vial transport medium  ?Result Value Ref Range Status  ? SARS Coronavirus 2 by RT PCR NEGATIVE NEGATIVE Final  ?  Comment: (NOTE) ?SARS-CoV-2 target nucleic acids are NOT DETECTED. ? ?The SARS-CoV-2 RNA is generally detectable in upper respiratory ?specimens during the acute phase of infection. The  lowest ?concentration of SARS-CoV-2 viral copies this assay can detect is ?138 copies/mL. A negative result does not preclude SARS-Cov-2 ?infection and should not be used as the sole basis for treatment or ?other patient management decisions. A negative result may occur with  ?improper specimen collection/handling, submission of specimen other ?than nasopharyngeal swab, presence of viral mutation(s) within the ?areas targeted by this assay, and inadequate number of viral ?copies(<138 copies/mL). A negative result  must be combined with ?clinical observations, patient history, and epidemiological ?information. The expected result is Negative. ? ?Fact Sheet for Patients:  ?EntrepreneurPulse.com.au ? ?Fact Sheet for Healthcare Providers:  ?IncredibleEmployment.be ? ?This test is no t yet approved or cleared by the [place] FDA and  ?has been authorized for detection and/or diagnosis of SARS-CoV-2 by ?FDA under an Emergency Use Authorization (EUA). This EUA will remain  ?in effect (meaning this test can be used) for the duration of the ?COVID-19 declaration under Section 564(b)(1) of the Act, 21 ?U.S.C.section 360bbb-3(b)(1), unless the authorization is terminated  ?or revoked sooner.  ? ? ?  ? Influenza A by PCR NEGATIVE NEGATIVE Final  ? Influenza B by PCR NEGATIVE NEGATIVE Final  ?  Comment: (NOTE) ?The Xpert Xpress SARS-CoV-2/FLU/RSV plus assay is intended as an aid ?in the diagnosis of influenza from Nasopharyngeal swab specimens and ?should not be used as a sole basis for treatment. Nasal washings and ?aspirates are unacceptable for Xpert Xpress SARS-CoV-2/FLU/RSV ?testing. ? ?Fact Sheet for Patients: ?EntrepreneurPulse.com.au ? ?Fact Sheet for Healthcare Providers: ?IncredibleEmployment.be ? ?This test is not yet approved or cleared by the [place] FDA and ?has been authorized for detection and/or diagnosis of SARS-CoV-2 by ?FDA under  an Emergency Use Authorization (EUA). This EUA will remain ?in effect (meaning this test can be used) for the duration of the ?COVID-19 declaration under Section 564(b)(1) of the Act, 21 U.S.C. ?section 360bbb-3(b)(1), unles

## 2022-02-05 NOTE — Evaluation (Signed)
Physical Therapy Evaluation ?Patient Details ?Name: Benjamin Walls ?MRN: [id number] ?DOB: 12/16/1929 ?Today's Date: 02/05/2022 ? ?History of Present Illness ? Pt is an 88 y.o. male who presents to the ED for evaluation after a unwitnessed fall on (4/1). Currently admitted due to hypercalcemia, rhabdomyolysis, elevated troponin, and fall. PmHx: HLD, HTN. ?  ?Clinical Impression ? Pt is awake and alert resting in bed w/ family at bedside upon PT entrance into room for evaluation. Pt is A&Ox4, denies any c/o pain at rest, and is very willing to work w/ PT today. Pt reports prior to hospitalization he lives in his 2-story home w/ his wife, is independent at baseline, and still driving. Does not use a DME at baseline, but is open to it after recent history of falls; states at baseline he was able to walk [place], but due to recent reports of weakness he hasn't been able to. ? ?Pt is able to complete bed mobility w/ SUPERVISION to sit EOB. Once seated EOB he is able to progress to standing w/ minA using RW. He requests to use bathroom to attempt BM; is able to complete all pericare needs w/ single UE support and is able to ambulate to recliner. Further ambulation was differed due to increased time spent on his feet and pt report of feeling fatigued after it being his first time up in awhile; states it felt great to get up and get moving. Pt will benefit from continued skilled PT in order to increase LE strength, improve functional mobility and coordination, and restore PLOF. Current discharge recommendation to HHPT is appropriate due to the level of assistance required by the patient to ensure safety and improve overall function. ? ?   ? ?Recommendations for follow up therapy are one component of a multi-disciplinary discharge planning process, led by the attending physician.  Recommendations may be updated based on patient status, additional functional criteria and insurance  authorization. ? ?Follow Up Recommendations Home health PT ? ?  ?Assistance Recommended at Discharge Intermittent Supervision/Assistance  ?Patient can return home with the following ? A little help with walking and/or transfers;A little help with bathing/dressing/bathroom;Assistance with cooking/housework;Assist for transportation;Help with stairs or ramp for entrance ? ?  ?Equipment Recommendations Rolling walker (2 wheels)  ?Recommendations for Other Services ?    ?  ?Functional Status Assessment Patient has had a recent decline in their functional status and demonstrates the ability to make significant improvements in function in a reasonable and predictable amount of time.  ? ?  ?Precautions / Restrictions Precautions ?Precautions: Fall ?Restrictions ?Weight Bearing Restrictions: No  ? ?  ? ?Mobility ? Bed Mobility ?Overal bed mobility: Needs Assistance ?Bed Mobility: Supine to Sit ?  ?  ?Supine to sit: Supervision ?  ?  ?  ?  ? ?Transfers ?Overall transfer level: Needs assistance ?Equipment used: Rolling walker (2 wheels) ?Transfers: Sit to/from Stand ?Sit to Stand: Min assist ?  ?  ?  ?  ?  ?  ?  ? ?Ambulation/Gait ?Ambulation/Gait assistance: Min guard ?Gait Distance (Feet): 15 Feet ?Assistive device: Rolling walker (2 wheels) ?Gait Pattern/deviations: Step-to pattern, Decreased step length - right, Decreased step length - left, Decreased stride length ?Gait velocity: decreased ?  ?  ?  ? ?Stairs ?  ?  ?  ?  ?  ? ?Wheelchair Mobility ?  ? ?Modified Rankin (Stroke Patients Only) ?  ? ?  ? ?Balance Overall balance assessment: Needs assistance ?Sitting-balance support:  Feet supported ?Sitting balance-Leahy Scale: Good ?  ?  ?Standing balance support: Bilateral upper extremity supported, During functional activity, Reliant on assistive device for balance ?Standing balance-Leahy Scale: Fair ?  ?  ?  ?  ?  ?  ?  ?  ?  ?  ?  ?  ?   ? ? ? ?Pertinent Vitals/Pain Pain Assessment ?Pain Assessment: No/denies pain   ? ? ?Home Living Family/patient expects to be discharged to:: Private residence ?Living Arrangements: Spouse/significant other ?Available Help at Discharge: Family;Available PRN/intermittently ?Type of Home: House ?Home Access: Stairs to enter ?  ?Entrance Stairs-Number of Steps: 1 threshold step ?Alternate Level Stairs-Number of Steps: 1 flight of stairs ?Home Layout: Two level ?Home Equipment: None ?   ?  ?Prior Function Prior Level of Function : Independent/Modified Independent ?  ?  ?  ?  ?  ?  ?  ?  ?  ? ? ?Hand Dominance  ?   ? ?  ?Extremity/Trunk Assessment  ? Upper Extremity Assessment ?Upper Extremity Assessment: Overall WFL for tasks assessed;Generalized weakness ?  ? ?Lower Extremity Assessment ?Lower Extremity Assessment: Overall WFL for tasks assessed;Generalized weakness ?  ? ?   ?Communication  ?    ?Cognition Arousal/Alertness: Awake/alert ?Behavior During Therapy: [facility] for tasks assessed/performed ?Overall Cognitive Status: Within Functional Limits for tasks assessed ?  ?  ?  ?  ?  ?  ?  ?  ?  ?  ?  ?  ?  ?  ?  ?  ?  ?  ?  ? ?  ?General Comments   ? ?  ?Exercises    ? ?Assessment/Plan  ?  ?PT Assessment Patient needs continued PT services  ?PT Problem List Decreased mobility;Decreased strength;Decreased safety awareness;Decreased range of motion;Decreased coordination;Decreased knowledge of precautions;Decreased activity tolerance;Decreased balance;Decreased knowledge of use of DME ? ?   ?  ?PT Treatment Interventions DME instruction;Therapeutic exercise;Gait training;Balance training;Stair training;Neuromuscular re-education;Functional mobility training;Therapeutic activities;Patient/family education   ? ?PT Goals (Current goals can be found in the Care Plan section)  ?Acute Rehab PT Goals ?Patient Stated Goal: to get stronger to go home ?PT Goal Formulation: With patient ?Time For Goal Achievement: 02/19/22 ?Potential to Achieve Goals: Good ? ?  ?Frequency Min 2X/week ?  ? ? ?Co-evaluation   ?  ?   ?  ?  ? ? ?  ?AM-PAC PT "6 Clicks" Mobility  ?Outcome Measure Help needed turning from your back to your side while in a flat bed without using bedrails?: A Little ?Help needed moving from lying on your back to sitting on the side of a flat bed without using bedrails?: A Little ?Help needed moving to and from a bed to a chair (including a wheelchair)?: A Little ?Help needed standing up from a chair using your arms (e.g., wheelchair or bedside chair)?: A Little ?Help needed to walk in hospital room?: A Little ?Help needed climbing 3-5 steps with a railing? : A Lot ?6 Click Score: 17 ? ?  ?End of Session Equipment Utilized During Treatment: Gait belt ?Activity Tolerance: Patient tolerated treatment well ?Patient left: with chair alarm set;in chair;with call bell/phone within reach ?Nurse Communication: Mobility status ?PT Visit Diagnosis: Unsteadiness on feet (R26.81);History of falling (Z91.81);Muscle weakness (generalized) (M62.81) ?  ? ?Time: 7353-2992 ?PT Time Calculation (min) (ACUTE ONLY): 30 min ? ? ?Charges:   PT Evaluation ?$PT Eval Low Complexity: 1 Low ?PT Treatments ?$Therapeutic Activity: 8-22 mins ?  ?   ? ? ?  Jonnie Kind, SPT ?02/05/2022, 3:14 PM ? ?

## 2022-02-05 NOTE — Progress Notes (Signed)
SUBJECTIVE: Benjamin Walls is a 88 y.o. male with medical history significant for hypertension and dyslipidemia who presents to the ER for evaluation after an unwitnessed fall at about 3 AM this morning. Patient states that he has been weak for several weeks and has had very poor oral intake.  He got out of bed at about 3 AM to use the bathroom and felt dizzy and slid to the floor.  He was unable to get up and he remained on the floor for several hours before his wife found him.  He denies hitting his head or loss of consciousness. ?His oral intake has been very poor and he also complains of constipation.  He denies having any nausea or vomiting. ?He denies having any chest pain, no shortness of breath, no abdominal pain, no urinary symptoms, no fever, no chills, no cough, no leg swelling, no blurred vision or any focal deficits. ?  ?Patient reports no known history of atherosclerotic heart disease. Patient states his weakness and poor oral intake started a couple weeks ago after he was started on magnesium oxide per his PCP.  ? ? ?Vitals:  ? 02/04/22 2054 02/04/22 2109 02/05/22 0418 02/05/22 0751  ?BP:  (!) 150/57 (!) 142/67 136/62  ?Pulse:  69 67 65  ?Resp:  '16 16 18  '$ ?Temp: 98.3 ?F (36.8 ?C) 98 ?F (36.7 ?C) 98.1 ?F (36.7 ?C) (!) 97.4 ?F (36.3 ?C)  ?TempSrc: Oral Oral Oral   ?SpO2:  97% 99% (!) 87%  ?Weight:      ?Height:      ? ? ?Intake/Output Summary (Last 24 hours) at 02/05/2022 1012 ?Last data filed at 02/05/2022 0459 ?Gross per 24 hour  ?Intake 861.95 ml  ?Output 400 ml  ?Net 461.95 ml  ? ? ?LABS: ?Basic Metabolic Panel: ?Recent Labs  ?  02/04/22 ?0930 02/05/22 ?0045  ?NA 140 139  ?K 3.1* 3.2*  ?CL 100 105  ?CO2 30 27  ?GLUCOSE 111* 87  ?BUN 53* 52*  ?CREATININE 1.18 0.88  ?CALCIUM 13.4* 12.1*  ?MG 1.7 2.0  ?PHOS  --  2.3*  ? ?Liver Function Tests: ?Recent Labs  ?  02/04/22 ?0930  ?AST 118*  ?ALT 41  ?ALKPHOS 90  ?BILITOT 1.3*  ?PROT 6.7  ?ALBUMIN 4.0  ? ?No results for input(s): LIPASE, AMYLASE in the last 72  hours. ?CBC: ?Recent Labs  ?  02/04/22 ?0930 02/05/22 ?0045  ?WBC 13.1* 7.9  ?NEUTROABS 11.6*  --   ?HGB 12.8* 11.8*  ?HCT 37.2* 33.6*  ?MCV 88.2 87.3  ?PLT 190 142*  ? ?Cardiac Enzymes: ?Recent Labs  ?  02/04/22 ?0930 02/05/22 ?0045  ?CKTOTAL 2,840* 2,576*  ? ?BNP: ?Invalid input(s): POCBNP ?D-Dimer: ?No results for input(s): DDIMER in the last 72 hours. ?Hemoglobin A1C: ?No results for input(s): HGBA1C in the last 72 hours. ?Fasting Lipid Panel: ?No results for input(s): CHOL, HDL, LDLCALC, TRIG, CHOLHDL, LDLDIRECT in the last 72 hours. ?Thyroid Function Tests: ?No results for input(s): TSH, T4TOTAL, T3FREE, THYROIDAB in the last 72 hours. ? ?Invalid input(s): FREET3 ?Anemia Panel: ?No results for input(s): VITAMINB12, FOLATE, FERRITIN, TIBC, IRON, RETICCTPCT in the last 72 hours. ? ? ?PHYSICAL EXAM ?General: Well developed, well nourished, in no acute distress ?HEENT:  Normocephalic and atramatic ?Neck:  No JVD.  ?Lungs: Clear bilaterally to auscultation and percussion. ?Heart: HRRR . Normal S1 and S2 without gallops or murmurs.  ?Abdomen: Bowel sounds are positive, abdomen soft and non-tender  ?Msk:  Back normal, normal gait. Normal  strength and tone for age. ?Extremities: No clubbing, cyanosis or edema.   ?Neuro: Alert and oriented X 3. ?Psych:  Good affect, responds appropriately ? ?TELEMETRY: irregular heart rhythm, HR 64 bpm ? ?ASSESSMENT AND PLAN: Patient feeling well with no complaints. Elevated troponin levels likely due to rhabdomyolysis. Continue current treatment. Echocardiogram pending completion. If abnormal wall motion, will schedule cardiac cath. Will continue to follow.  ? ?Principal Problem: ?  Hypercalcemia ?Active Problems: ?  Hypertension ?  Rhabdomyolysis ?  Elevated troponin ?  Hypokalemia ?  Dehydration ?  Fall ?  ? ?[employer], FNP-C ?02/05/2022 ?10:12 AM ? ? ? ?    ?

## 2022-02-06 ENCOUNTER — Inpatient Hospital Stay: Admit: 2022-02-06 | Discharge: 2022-02-06 | Disposition: A | Discharge status: Home / Self Care | Payer: Medicare Other

## 2022-02-06 DIAGNOSIS — E860 Dehydration: Secondary | ICD-10-CM

## 2022-02-06 DIAGNOSIS — W19XXXA Unspecified fall, initial encounter: Secondary | ICD-10-CM

## 2022-02-06 DIAGNOSIS — R778 Other specified abnormalities of plasma proteins: Secondary | ICD-10-CM

## 2022-02-06 DIAGNOSIS — T796XXA Traumatic ischemia of muscle, initial encounter: Secondary | ICD-10-CM

## 2022-02-06 DIAGNOSIS — E876 Hypokalemia: Secondary | ICD-10-CM

## 2022-02-06 LAB — PHOSPHORUS
Phosphorus: 1.2 mg/dL — ABNORMAL LOW (ref 2.5–4.6)
Phosphorus: 3.2 mg/dL (ref 2.5–4.6)

## 2022-02-06 LAB — BASIC METABOLIC PANEL
Anion gap: 7 (ref 5–15)
BUN: 45 mg/dL — ABNORMAL HIGH (ref 8–23)
CO2: 27 mmol/L (ref 22–32)
Calcium: 10.7 mg/dL — ABNORMAL HIGH (ref 8.9–10.3)
Chloride: 109 mmol/L (ref 98–111)
Creatinine, Ser: 0.89 mg/dL (ref 0.61–1.24)
GFR, Estimated: >60 mL/min (ref >60)
Glucose, Bld: 102 mg/dL — ABNORMAL HIGH (ref 70–99)
Potassium: 3.5 mmol/L (ref 3.5–5.1)
Sodium: 143 mmol/L (ref 135–145)

## 2022-02-06 LAB — CBC WITH DIFFERENTIAL/PLATELET
Abs Immature Granulocytes: 0.02 10*3/uL (ref 0.00–0.07)
Basophils Absolute: 0 10*3/uL (ref 0.0–0.1)
Basophils Relative: 0 %
Eosinophils Absolute: 0.2 10*3/uL (ref 0.0–0.5)
Eosinophils Relative: 3 %
HCT: 33.6 % — ABNORMAL LOW (ref 39.0–52.0)
Hemoglobin: 11.5 g/dL — ABNORMAL LOW (ref 13.0–17.0)
Immature Granulocytes: 0 %
Lymphocytes Relative: 7 %
Lymphs Abs: 0.5 10*3/uL — ABNORMAL LOW (ref 0.7–4.0)
MCH: 30.3 pg (ref 26.0–34.0)
MCHC: 34.2 g/dL (ref 30.0–36.0)
MCV: 88.7 fL (ref 80.0–100.0)
Monocytes Absolute: 0.6 10*3/uL (ref 0.1–1.0)
Monocytes Relative: 9 %
Neutro Abs: 5.4 10*3/uL (ref 1.7–7.7)
Neutrophils Relative %: 81 %
Platelets: 135 10*3/uL — ABNORMAL LOW (ref 150–400)
RBC: 3.79 MIL/uL — ABNORMAL LOW (ref 4.22–5.81)
RDW: 13.2 % (ref 11.5–15.5)
WBC: 6.8 10*3/uL (ref 4.0–10.5)
nRBC: 0 % (ref 0.0–0.2)

## 2022-02-06 LAB — ECHOCARDIOGRAM COMPLETE
AR max vel: 2.26 cm2
AV Area VTI: 2.25 cm2
AV Area mean vel: 2.4 cm2
AV Mean grad: 2 mmHg
AV Peak grad: 4.5 mmHg
Ao pk vel: 1.06 m/s
Area-P 1/2: 2.97 cm2
Height: 73 in
MV VTI: 1.82 cm2
S' Lateral: 2.5 cm
Weight: 2610.25 oz

## 2022-02-06 LAB — MAGNESIUM: Magnesium: 1.7 mg/dL (ref 1.7–2.4)

## 2022-02-06 LAB — CK: Total CK: 759 U/L — ABNORMAL HIGH (ref 49–397)

## 2022-02-06 MED ORDER — SODIUM PHOSPHATES 45 MMOLE/15ML IV SOLN
30.0000 mmol | Freq: Once | INTRAVENOUS | Status: AC
  Administered 2022-02-06: 30 mmol via INTRAVENOUS
  Filled 2022-02-06: qty 10

## 2022-02-06 NOTE — TOC Initial Note (Addendum)
Transition of Care (TOC) - Initial/Assessment Note  ? ? ?Patient Details  ?Name: Benjamin Walls ?MRN: [id number] ?Date of Birth: 1929-11-21 ? ?Transition of Care (TOC) CM/SW Contact:    ?Candie Chroman, LCSW ?Phone Number: ?02/06/2022, 1:58 PM ? ?Clinical Narrative:   CSW met with patient. Wife at bedside. CSW introduced role and explained that therapy recommendations would be discussed. Patient and his wife are agreeable to home health PT and OT. They will review CMS scores and follow up with agency preference. Patient agreeable to DME recommendation for RW. No further concerns. CSW encouraged patient and his wife to contact CSW as needed. CSW will continue to follow patient for support and facilitate return home when stable.     ? ?3:35 pm: Ordered walker through [employer].          ? ?Expected Discharge Plan: [place] ?Barriers to Discharge: Continued Medical Work up ? ? ?Patient Goals and CMS Choice ?  ?CMS Medicare.gov Compare Post Acute Care list provided to:: Patient (Wife at bedside) ?  ? ?Expected Discharge Plan and Services ?Expected Discharge Plan: [place] ?  ?  ?Post Acute Care Choice: Durable Medical Equipment, Home Health ?Living arrangements for the past 2 months: [place] ?                ?  ?  ?  ?  ?  ?  ?  ?  ?  ?  ? ?Prior Living Arrangements/Services ?Living arrangements for the past 2 months: [place] ?Lives with:: Spouse ?Patient language and need for interpreter reviewed:: Yes ?Do you feel safe going back to the place where you live?: Yes      ?Need for Family Participation in Patient Care: Yes (Comment) ?Care giver support system in place?: Yes (comment) ?  ?Criminal Activity/Legal Involvement Pertinent to Current Situation/Hospitalization: No - Comment as needed ? ?Activities of Daily Living ?Home Assistive Devices/Equipment: Eyeglasses ?ADL Screening (condition at time of admission) ?Patient's cognitive ability adequate to safely  complete daily activities?: Yes ?Is the patient deaf or have difficulty hearing?: No ?Does the patient have difficulty seeing, even when wearing glasses/contacts?: No ?Does the patient have difficulty concentrating, remembering, or making decisions?: No ?Patient able to express need for assistance with ADLs?: Yes ?Does the patient have difficulty dressing or bathing?: No ?Independently performs ADLs?: Yes (appropriate for developmental age) ?Does the patient have difficulty walking or climbing stairs?: No ?Weakness of Legs: Both ?Weakness of Arms/Hands: None ? ?Permission Sought/Granted ?Permission sought to share information with : Family Supports ?  ? Share Information with NAME: Benjamin Walls ?   ? Permission granted to share info w Relationship: Wife ? Permission granted to share info w Contact Information: [phone] ? ?Emotional Assessment ?Appearance:: Appears stated age ?Attitude/Demeanor/Rapport: Engaged, Gracious ?Affect (typically observed): Accepting, Appropriate, Calm, Pleasant ?Orientation: : Oriented to Self, Oriented to Place, Oriented to  Time, Oriented to Situation ?Alcohol / Substance Use: Not Applicable ?Psych Involvement: No (comment) ? ?Admission diagnosis:  Hypercalcemia [E83.52] ?Weakness [R53.1] ?Fall, initial encounter [W19.XXXA] ?Patient Active Problem List  ? Diagnosis Date Noted  ? Hypercalcemia 02/04/2022  ? Rhabdomyolysis 02/04/2022  ? Elevated troponin 02/04/2022  ? Hypokalemia 02/04/2022  ? Dehydration 02/04/2022  ? Fall 02/04/2022  ? Heart murmur, systolic 15/83/0940  ? Encounter for immunization 10/10/2020  ? Senile purpura ([place]) 07/31/2018  ? Choledocholithiasis 09/13/2015  ? HLD (hyperlipidemia) 09/13/2015  ? Hypertension 09/13/2015  ? BPH (benign prostatic  hypertrophy) 09/13/2015  ? Hernia, inguinal, left 09/05/2011  ? ?PCP:  Birdie Sons, MD ?Pharmacy:   ?[id number] [address], [address] ?[address] ?[place] [address] ?Phone: [phone] Fax: [phone] ? ? ? ? ?Social Determinants of Health (SDOH) Interventions ?  ? ?Readmission Risk Interventions ?   ? View : No data to display.  ?  ?  ?  ? ? ? ?

## 2022-02-06 NOTE — Progress Notes (Signed)
Physical Therapy Treatment ?Patient Details ?Name: Benjamin Walls ?MRN: [id number] ?DOB: 1929/12/10 ?Today's Date: 02/06/2022 ? ? ?History of Present Illness Pt is an 88 y.o. male who presents to the ED for evaluation after a unwitnessed fall on (4/1). Currently admitted due to hypercalcemia, rhabdomyolysis, elevated troponin, and fall. PmHx: HLD, HTN. ? ?  ?PT Comments  ? ? Pt awake and alert resting in bed upon PT entrance into room for today's session. Pt is very willing to work w/ PT today. He is able to perform bed mobility w/ SUPERVISION to sit EOB. Once seated EOB he is able to progress to standing w/ CGA using RW. He is able to ambulate ~72f w/ CGA using RW; states he could go farther, but asks to return to room due to feet being sore from walking in socks. I did find Pt's shoes for him to don/doff with next attempts of ambulation. Pt resting in recliner w/ all needs w/in reach and eating breakfast prior to PT exiting room. Pt will benefit from continued skilled PT in order to increase LE strength/endurance, improve mobility/gait/balance, and restore PLOF. Current discharge recommendation remains appropriate due to the level of assistance required by the patient to ensure safety and improve overall function. ?  ?Recommendations for follow up therapy are one component of a multi-disciplinary discharge planning process, led by the attending physician.  Recommendations may be updated based on patient status, additional functional criteria and insurance authorization. ? ?Follow Up Recommendations ? Home health PT ?  ?  ?Assistance Recommended at Discharge Intermittent Supervision/Assistance  ?Patient can return home with the following A little help with walking and/or transfers;A little help with bathing/dressing/bathroom;Assistance with cooking/housework;Assist for transportation;Help with stairs or ramp for entrance ?  ?Equipment Recommendations ? Rolling walker (2 wheels)  ?  ?Recommendations for Other Services    ? ? ?  ?Precautions / Restrictions Precautions ?Precautions: Fall ?Restrictions ?Weight Bearing Restrictions: No  ?  ? ?Mobility ? Bed Mobility ?Overal bed mobility: Needs Assistance ?Bed Mobility: Supine to Sit ?  ?  ?Supine to sit: Supervision ?  ?  ?  ?  ? ?Transfers ?Overall transfer level: Needs assistance ?Equipment used: Rolling walker (2 wheels) ?Transfers: Sit to/from Stand ?Sit to Stand: Min guard ?  ?  ?  ?  ?  ?  ?  ? ?Ambulation/Gait ?Ambulation/Gait assistance: Min guard ?Gait Distance (Feet): 60 Feet ?Assistive device: Rolling walker (2 wheels) ?Gait Pattern/deviations: Step-to pattern, Decreased step length - right, Decreased step length - left, Decreased stride length ?Gait velocity: decreased ?  ?  ?  ? ? ?Stairs ?  ?  ?  ?  ?  ? ? ?Wheelchair Mobility ?  ? ?Modified Rankin (Stroke Patients Only) ?  ? ? ?  ?Balance Overall balance assessment: Needs assistance ?Sitting-balance support: Feet supported, No upper extremity supported ?Sitting balance-Leahy Scale: Good ?  ?  ?Standing balance support: Bilateral upper extremity supported, Reliant on assistive device for balance ?Standing balance-Leahy Scale: Fair ?  ?  ?  ?  ?  ?  ?  ?  ?  ?  ?  ?  ?  ? ?  ?Cognition Arousal/Alertness: Awake/alert ?Behavior [facility] Therapy: [facility]/performed ?Overall Cognitive Status: Within Functional Limits for tasks assessed ?  ?  ?  ?  ?  ?  ?  ?  ?  ?  ?  ?  ?  ?  ?  ?  ?  ?  ?  ? ?  ?  Exercises   ? ?  ?General Comments General comments (skin integrity, edema, etc.): HR 80s-90s ?  ?  ? ?Pertinent Vitals/Pain Pain Assessment ?Pain Assessment: No/denies pain  ? ? ?Home Living Family/patient expects to be discharged to:: Private residence ?Living Arrangements: Spouse/significant other ?Available Help at Discharge: Family;Available PRN/intermittently ?Type of Home: House ?Home Access: Stairs to enter ?  ?Entrance Stairs-Number of Steps: 1 threshold step ?Alternate Level Stairs-Number of Steps: 1 flight of  stairs ?Home Layout: Two level ?Home Equipment: Grab bars - tub/shower;Shower seat ?Additional Comments: has access to wife's shower chair but does not use  ?  ?Prior Function    ?  ?  ?   ? ?PT Goals (current goals can now be found in the care plan section) Progress towards PT goals: Progressing toward goals ? ?  ?Frequency ? ? ? Min 2X/week ? ? ? ?  ?PT Plan Current plan remains appropriate  ? ? ?Co-evaluation   ?  ?  ?  ?  ? ?  ?AM-PAC PT "6 Clicks" Mobility   ?Outcome Measure ? Help needed turning from your back to your side while in a flat bed without using bedrails?: A Little ?Help needed moving from lying on your back to sitting on the side of a flat bed without using bedrails?: A Little ?Help needed moving to and from a bed to a chair (including a wheelchair)?: A Little ?Help needed standing up from a chair using your arms (e.g., wheelchair or bedside chair)?: A Little ?Help needed to walk in hospital room?: A Little ?Help needed climbing 3-5 steps with a railing? : A Lot ?6 Click Score: 17 ? ?  ?End of Session Equipment Utilized During Treatment: Gait belt ?Activity Tolerance: Patient tolerated treatment well ?Patient left: with chair alarm set;in chair;with call bell/phone within reach ?Nurse Communication: Mobility status ?PT Visit Diagnosis: Unsteadiness on feet (R26.81);History of falling (Z91.81);Muscle weakness (generalized) (M62.81) ?  ? ? ?Time: 2774-1287 ?PT Time Calculation (min) (ACUTE ONLY): 30 min ? ?Charges:             ?          ? ? ? ?Jonnie Kind, SPT ?02/06/2022, 11:50 AM ? ?

## 2022-02-06 NOTE — TOC Transition Note (Signed)
Transition of Care (TOC) - CM/SW Discharge Note ? ? ?Patient Details  ?Name: Benjamin Walls ?MRN: [id number] ?Date of Birth: [birth year] ? ?Transition of Care (TOC) CM/SW Contact:  ?Candie Chroman, LCSW ?Phone Number: ?02/06/2022, 4:58 PM ? ? ?Clinical Narrative:  Patient has orders to discharge home today. Walker delivered to the room. Patient prefers [facility]. Referral has been made for PT and OT. No further concerns. CSW signing off.  ? ?Final next level of care: [place] ?Barriers to Discharge: No Barriers Identified ? ? ?Patient Goals and CMS Choice ?  ?CMS Medicare.gov Compare Post Acute Care list provided to:: Patient (Wife at bedside) ?Choice offered to / list presented to : Patient ? ?Discharge Placement ?  ?           ?  ?  ?  ?Patient and family notified of of transfer: 02/06/22 ? ?Discharge Plan and Services ?  ?  ?Post Acute Care Choice: Durable Medical Equipment, Home Health          ?DME Arranged: Walker rolling ?DME Agency: [employer] ?Date DME Agency Contacted: 02/06/22 ?  ?Representative spoke with at DME Agency: Suanne Marker ?HH Arranged: PT, OT ?[place] Agency: [facility] ([place]) ?Date HH Agency Contacted: 02/06/22 ?  ?Representative spoke with at [place]: Floydene Flock ? ?Social Determinants of Health (SDOH) Interventions ?  ? ? ?Readmission Risk Interventions ?   ? View : No data to display.  ?  ?  ?  ? ? ? ? ? ?

## 2022-02-06 NOTE — Progress Notes (Incomplete)
{  Select_TRH_Note:[id number]} 

## 2022-02-06 NOTE — Evaluation (Signed)
Occupational Therapy Evaluation ?Patient Details ?Name: Benjamin Walls ?MRN: [id number] ?DOB: April 25, 1930 ?Today's Date: 02/06/2022 ? ? ?History of Present Illness Pt is an 88 y.o. male who presents to the ED for evaluation after a unwitnessed fall on (4/1). Currently admitted due to hypercalcemia, rhabdomyolysis, elevated troponin, and fall. PmHx: HLD, HTN.  ? ?Clinical Impression ?  ?Pt seen for OT evaluation this date. Pt was independent in all ADLs and functional mobility, living in a 2-story home with wife. Pt denies any other falls within the past 6 months. Pt currently presents with decreased strength and activity tolerance. Due to these functional impairments, pt requires MIN A for seated LB dressing, MIN GUARD for sit>stand transfers, SUPERVISION for functional mobility of household distances with RW, and SUPERVISION for standing grooming tasks. This date, pt able to stand for 21mns while completing standing grooming tasks however endorses decreased activity tolerance compared to baseline. Pt would benefit from additional skilled OT services to maximize return to PLOF and minimize risk of future falls, injury, caregiver burden, and readmission. Upon discharge, recommend [facility].      ? ?Recommendations for follow up therapy are one component of a multi-disciplinary discharge planning process, led by the attending physician.  Recommendations may be updated based on patient status, additional functional criteria and insurance authorization.  ? ?Follow Up Recommendations ? Home health OT  ?  ?Assistance Recommended at Discharge PRN  ?Patient can return home with the following A little help with walking and/or transfers;A little help with bathing/dressing/bathroom ? ?  ?Functional Status Assessment ? Patient has had a recent decline in their functional status and demonstrates the ability to make significant improvements in function in a reasonable and predictable amount of time.  ?Equipment Recommendations ?  None recommended by OT  ?  ?   ?Precautions / Restrictions Precautions ?Precautions: Fall ?Restrictions ?Weight Bearing Restrictions: No  ? ?  ? ?Mobility Bed Mobility ?  ?  ?  ?  ?  ?  ?  ?General bed mobility comments: not assessed, pt in recliner pre/post session ?  ? ?Transfers ?Overall transfer level: Needs assistance ?Equipment used: Rolling walker (2 wheels) ?Transfers: Sit to/from Stand ?Sit to Stand: Min guard ?  ?  ?  ?  ?  ?  ?  ? ?  ?Balance Overall balance assessment: Needs assistance ?Sitting-balance support: Feet supported, No upper extremity supported ?Sitting balance-Leahy Scale: Good ?Sitting balance - Comments: Good sitting balance reaching outside BOS to don shoes ?  ?Standing balance support: No upper extremity supported, During functional activity ?Standing balance-Leahy Scale: Fair ?Standing balance comment: Requires SUPERVISION for standing grooming tasks. Able to stand for 15ms ?  ?  ?  ?  ?  ?  ?  ?  ?  ?  ?  ?   ? ?ADL either performed or assessed with clinical judgement  ? ?ADL Overall ADL's : Needs assistance/impaired ?  ?  ?Grooming: Wash/dry hands;Wash/dry face;Oral care;Applying deodorant;Brushing hair;Supervision/safety;Set up;Standing ?  ?  ?  ?  ?  ?  ?  ?Lower Body Dressing: Minimal assistance;Sitting/lateral leans ?Lower Body Dressing Details (indicate cue type and reason): to don/doff shoes . Requires MIN A to don over heels ?  ?  ?  ?  ?  ?  ?Functional mobility during ADLs: Supervision/safety;Rolling walker (2 wheels) ?   ? ? ? ?Vision Ability to See in Adequate Light: 0 Adequate ?Patient Visual Report: No change from baseline ?   ?   ?   ?   ? ?  Pertinent Vitals/Pain Pain Assessment ?Pain Assessment: No/denies pain  ? ? ? ?Hand Dominance Right ?  ?Extremity/Trunk Assessment Upper Extremity Assessment ?Upper Extremity Assessment: Generalized weakness ?  ?Lower Extremity Assessment ?Lower Extremity Assessment: Generalized weakness ?  ?  ?  ?Communication  Communication ?Communication: No difficulties ?  ?Cognition Arousal/Alertness: Awake/alert ?Behavior During Therapy: [facility] for tasks assessed/performed ?Overall Cognitive Status: Within Functional Limits for tasks assessed ?  ?  ?  ?  ?  ?  ?  ?  ?  ?  ?  ?  ?  ?  ?  ?  ?  ?  ?  ?General Comments  HR 80s-90s ? ?  ?   ?   ? ? ?Home Living Family/patient expects to be discharged to:: Private residence ?Living Arrangements: Spouse/significant other ?Available Help at Discharge: Family;Available PRN/intermittently ?Type of Home: House ?Home Access: Stairs to enter ?Entrance Stairs-Number of Steps: 1 threshold step ?  ?Home Layout: Two level ?Alternate Level Stairs-Number of Steps: 1 flight of stairs ?  ?Bathroom Shower/Tub: Tub/shower unit ?  ?Bathroom Toilet: Handicapped height ?  ?  ?Home Equipment: Grab bars - tub/shower;Shower seat ?  ?Additional Comments: has access to wife's shower chair but does not use ?  ? ?  ?Prior Functioning/Environment Prior Level of Function : Independent/Modified Independent ?  ?  ?  ?  ?  ?  ?Mobility Comments: Independent without AD for functional mobility. Denies any prior falls ?ADLs Comments: Independent with ADLs ?  ? ?  ?  ?OT Problem List: Decreased strength;Decreased activity tolerance;Impaired balance (sitting and/or standing) ?  ?   ?OT Treatment/Interventions: Self-care/ADL training;Therapeutic exercise;Energy conservation;DME and/or AE instruction;Therapeutic activities;Patient/family education;Balance training  ?  ?OT Goals(Current goals can be found in the care plan section) Acute Rehab OT Goals ?Patient Stated Goal: to get stronger ?OT Goal Formulation: With patient ?Time For Goal Achievement: 02/20/22 ?Potential to Achieve Goals: Good ?ADL Goals ?Pt Will Perform Grooming: with modified independence;standing ?Pt Will Transfer to Toilet: with modified independence;ambulating;regular height toilet ?Pt Will Perform Toileting - Clothing Manipulation and hygiene: with modified  independence;sitting/lateral leans  ?OT Frequency: Min 2X/week ?  ? ?   ?AM-PAC OT "6 Clicks" Daily Activity     ?Outcome Measure Help from another person eating meals?: None ?Help from another person taking care of personal grooming?: A Little ?Help from another person toileting, which includes using toliet, bedpan, or urinal?: A Little ?Help from another person bathing (including washing, rinsing, drying)?: A Little ?Help from another person to put on and taking off regular upper body clothing?: None ?Help from another person to put on and taking off regular lower body clothing?: A Little ?6 Click Score: 20 ?  ?End of Session Equipment Utilized During Treatment: Rolling walker (2 wheels) ?Nurse Communication: Mobility status ? ?Activity Tolerance: Patient tolerated treatment well ?Patient left: in chair;with call bell/phone within reach;with chair alarm set ? ?OT Visit Diagnosis: Unsteadiness on feet (R26.81);History of falling (Z91.81)  ?              ?Time: [phone] ?OT Time Calculation (min): 38 min ?Charges:  OT General Charges ?$OT Visit: 1 Visit ?OT Evaluation ?$OT Eval Moderate Complexity: 1 Mod ?OT Treatments ?$Self Care/Home Management : 23-37 mins ? ?Fredirick Maudlin, OTR/L ?[place] ? ?

## 2022-02-06 NOTE — Progress Notes (Signed)
*  PRELIMINARY RESULTS* ?Echocardiogram ?2D Echocardiogram has been performed. ? ?Benjamin Walls, Benjamin Walls ?02/06/2022, 1:17 PM ?

## 2022-02-06 NOTE — Progress Notes (Signed)
SUBJECTIVE: Benjamin Walls is a 88 y.o. male with medical history significant for hypertension and dyslipidemia who presented to the ER for evaluation after an unwitnessed fall at about 3 AM in the morning. P  ?Patient reports no known history of atherosclerotic heart disease. Patient states his weakness and poor oral intake started a couple weeks ago after he was started on magnesium oxide per his PCP.  ? ?Patient denies chest pain, dizziness, shortness of breath. ? ?Vitals:  ? 02/05/22 1609 02/05/22 2038 02/06/22 0437 02/06/22 0758  ?BP: (!) 162/71 (!) 146/84 (!) 146/67 (!) 166/91  ?Pulse: 64 83 75 74  ?Resp: '18 18 20 16  '$ ?Temp: 98.7 ?F (37.1 ?C) 98.3 ?F (36.8 ?C) 97.6 ?F (36.4 ?C) 98 ?F (36.7 ?C)  ?TempSrc:   Oral Oral  ?SpO2: 100% 98% 100% 98%  ?Weight:      ?Height:      ? ? ?Intake/Output Summary (Last 24 hours) at 02/06/2022 0855 ?Last data filed at 02/06/2022 0445 ?Gross per 24 hour  ?Intake 497.23 ml  ?Output 700 ml  ?Net -202.77 ml  ? ? ?LABS: ?Basic Metabolic Panel: ?Recent Labs  ?  02/05/22 ?0045 02/06/22 ?8088  ?NA 139 143  ?K 3.2* 3.5  ?CL 105 109  ?CO2 27 27  ?GLUCOSE 87 102*  ?BUN 52* 45*  ?CREATININE 0.88 0.89  ?CALCIUM 12.1* 10.7*  ?MG 2.0 1.7  ?PHOS 2.3* 1.2*  ? ?Liver Function Tests: ?Recent Labs  ?  02/04/22 ?0930  ?AST 118*  ?ALT 41  ?ALKPHOS 90  ?BILITOT 1.3*  ?PROT 6.7  ?ALBUMIN 4.0  ? ?No results for input(s): LIPASE, AMYLASE in the last 72 hours. ?CBC: ?Recent Labs  ?  02/04/22 ?0930 02/05/22 ?0045 02/06/22 ?1103  ?WBC 13.1* 7.9 6.8  ?NEUTROABS 11.6*  --  5.4  ?HGB 12.8* 11.8* 11.5*  ?HCT 37.2* 33.6* 33.6*  ?MCV 88.2 87.3 88.7  ?PLT 190 142* 135*  ? ?Cardiac Enzymes: ?Recent Labs  ?  02/04/22 ?0930 02/05/22 ?0045 02/06/22 ?1594  ?CKTOTAL 2,840* 2,576* 759*  ? ?BNP: ?Invalid input(s): POCBNP ?D-Dimer: ?No results for input(s): DDIMER in the last 72 hours. ?Hemoglobin A1C: ?No results for input(s): HGBA1C in the last 72 hours. ?Fasting Lipid Panel: ?No results for input(s): CHOL, HDL, LDLCALC,  TRIG, CHOLHDL, LDLDIRECT in the last 72 hours. ?Thyroid Function Tests: ?No results for input(s): TSH, T4TOTAL, T3FREE, THYROIDAB in the last 72 hours. ? ?Invalid input(s): FREET3 ?Anemia Panel: ?No results for input(s): VITAMINB12, FOLATE, FERRITIN, TIBC, IRON, RETICCTPCT in the last 72 hours. ? ? ?PHYSICAL EXAM ?General: Well developed, well nourished, in no acute distress ?HEENT:  Normocephalic and atramatic ?Neck:  No JVD.  ?Lungs: Clear bilaterally to auscultation and percussion. ?Heart: HRRR . Normal S1 and S2 without gallops or murmurs.  ?Abdomen: Bowel sounds are positive, abdomen soft and non-tender  ?Msk:  Back normal, normal gait. Normal strength and tone for age. ?Extremities: No clubbing, cyanosis or edema.   ?Neuro: Alert and oriented X 3. ?Psych:  Good affect, responds appropriately ? ?TELEMETRY: irregular heart rhythm, HR 77 bpm ? ?ASSESSMENT AND PLAN: Patient feeling well with no complaints. Elevated troponin levels likely due to rhabdomyolysis. Continue current treatment. Echocardiogram pending completion. If abnormal wall motion, will schedule cardiac cath. Will continue to follow.  ?  ? ?Principal Problem: ?  Hypercalcemia ?Active Problems: ?  Hypertension ?  Rhabdomyolysis ?  Elevated troponin ?  Hypokalemia ?  Dehydration ?  Fall ?  ? ?[employer], FNP-C ?02/06/2022 ?  8:55 AM ? ? ? ?  ?

## 2022-02-06 NOTE — Discharge Summary (Addendum)
?Physician Discharge Summary ?  ?Patient: Benjamin Walls MRN: [id number] DOB: 07-19-1930  ?Admit date:     02/04/2022  ?Discharge date: 02/06/22  ?Discharge Physician: Jennye Boroughs  ? ?PCP: Birdie Sons, MD  ? ?Recommendations at discharge:  ? ? ?Follow-up with PCP in 1 week ?Recommend fine-needle aspiration of right thyroid nodule ?Outpatient follow-up of right lung nodule with PCP is recommended. ? ?Discharge Diagnoses: ?Principal Problem: ?  Hypercalcemia ?Active Problems: ?  Rhabdomyolysis ?  Elevated troponin ?  Hypertension ?  Hypokalemia ?  Dehydration ?  Fall ? ?Resolved Problems: ?  * No resolved hospital problems. * ? ?Hospital Course: ? ?[name] is a 88 y.o. male with medical history significant for CAD, hypertension, dyslipidemia, who presented to the hospital after a fall at home.  He said he got out of bed to use the bathroom.  He felt dizzy, lost his balance and fell.  He has had poor oral intake and generalized weakness for several days.  He was unable to get up from the floor by himself. ?  ? ?He was admitted to the hospital for generalized weakness leading to a fall at home.  He was found to have rhabdomyolysis, dehydration, hypercalcemia and elevated troponins.  He was treated with IV fluids.  He also had hypokalemia and hypophosphatemia that were repleted.   ? ?Cardiologist was consulted for elevated troponins.  2D echo showed normal EF and grade 1 diastolic dysfunction.  Elevated troponins was attributed to rhabdomyolysis.  He was evaluated by PT and OT recommended home health therapy.  His condition has improved and he wants to be discharged home today.  He is deemed stable for discharge.  Discharge plan was discussed with his wife over the phone. ? ? ? ? ?  ? ? ?Consultants: Cardiologist ?Procedures performed: None ?Disposition: Home health ?Diet recommendation:  ?Discharge Diet Orders (From admission, onward)  ? ?  Start     Ordered  ? 02/06/22 0000  Diet - low sodium heart  healthy       ? 02/06/22 1639  ? ?  ?  ? ?  ? ?Cardiac diet ?DISCHARGE MEDICATION: ?Allergies as of 02/06/2022   ? ?   Reactions  ? Lotensin  [benazepril Hcl]   ? Penicillins   ? ?  ? ?  ?Medication List  ?  ? ?STOP taking these medications   ? ?triamcinolone cream 0.1 % ?Commonly known as: KENALOG ?  ? ?  ? ?TAKE these medications   ? ?amLODipine 2.5 MG tablet ?Commonly known as: NORVASC ?TAKE 1 TABLET(2.5 MG) BY MOUTH DAILY ?  ?aspirin 81 MG tablet ?Take 1 tablet by mouth every other day. ?  ?co-enzyme Q-10 30 MG capsule ?Take 30 mg by mouth daily. ?  ?losartan-hydrochlorothiazide 100-25 MG tablet ?Commonly known as: HYZAAR ?TAKE 1 TABLET BY MOUTH DAILY ?  ?lovastatin 20 MG tablet ?Commonly known as: MEVACOR ?TAKE 1 TABLET(20 MG) BY MOUTH AT BEDTIME ?  ?MULTIVITAMIN PO ?Take by mouth. ?  ?OKEEFFES WORKING HANDS EX ?Apply topically daily. ?  ? ?  ? ?  ?  ? ? ?  ?Durable Medical Equipment  ?(From admission, onward)  ?  ? ? ?  ? ?  Start     Ordered  ? 02/06/22 0000  For home use only DME Walker rolling       ?Question Answer Comment  ?Walker: With 5 Inch Wheels   ?Patient needs a walker to treat with the  following condition Generalized weakness   ?  ? 02/06/22 1454  ? ?  ?  ? ?  ? ? Follow-up Information   ? ? [facility], [facility]-Home Follow up.   ?Specialty: Home Health Services ?Why: They will follow up with you for your home health needs: Physical therapy and occupational therapy. ? ?  ?  ? ?  ?  ? ?  ? ?Discharge Exam: ?Filed Weights  ? 02/04/22 0925  ?Weight: 74 kg  ? ?GEN: NAD ?SKIN: Warm and dry ?EYES: EOMI ?ENT: MMM ?CV: RRR ?PULM: CTA B ?ABD: soft, ND, NT, +BS ?CNS: AAO x 3, non focal ?EXT: No edema or tenderness ? ? ?Condition at discharge: good ? ?The results of significant diagnostics from this hospitalization (including imaging, microbiology, ancillary and laboratory) are listed below for reference.  ? ?Imaging Studies: ?DG Chest 2 View ? ?Result Date: 02/04/2022 ?CLINICAL DATA:  Trauma, fall,  generalized weakness EXAM: CHEST - 2 VIEW COMPARISON:  None. FINDINGS: The heart size and mediastinal contours are within normal limits. Both lungs are clear. The visualized skeletal structures are unremarkable. IMPRESSION: No active cardiopulmonary disease. Electronically Signed   By: Elmer Picker M.D.   On: 02/04/2022 10:32  ? ?CT HEAD WO CONTRAST (5MM) ? ?Result Date: 02/04/2022 ?CLINICAL DATA:  88 year old male with head and neck pain following fall. Initial encounter. EXAM: CT HEAD WITHOUT CONTRAST CT CERVICAL SPINE WITHOUT CONTRAST TECHNIQUE: Multidetector CT imaging of the head and cervical spine was performed following the standard protocol without intravenous contrast. Multiplanar CT image reconstructions of the cervical spine were also generated. RADIATION DOSE REDUCTION: This exam was performed according to the departmental dose-optimization program which includes automated exposure control, adjustment of the mA and/or kV according to patient size and/or use of iterative reconstruction technique. COMPARISON:  None. FINDINGS: CT HEAD FINDINGS Brain: No evidence of acute infarction, hemorrhage, hydrocephalus, extra-axial collection or mass lesion/mass effect. Atrophy and probable chronic small-vessel white matter ischemic changes are noted. Vascular: Carotid and vertebral atherosclerotic calcifications are noted. Skull: Normal. Negative for fracture or focal lesion. Sinuses/Orbits: No acute finding. Mucosal thickening in LEFT paranasal sinuses noted. Other: None CT CERVICAL SPINE FINDINGS Alignment: Reversal the normal cervical lordosis is noted. 2 mm retrolisthesis of C4 on C5 and C5 on C6 are likely degenerative. Skull base and vertebrae: No acute fracture. No primary bone lesion or focal pathologic process. Soft tissues and spinal canal: No prevertebral fluid or swelling. No visible canal hematoma. Disc levels: Multilevel degenerative disc disease/spondylosis and facet arthropathy noted, with  moderate to severe degenerative disc disease/spondylosis noted at C4-5 and C5-6. These findings contribute to mild central spinal and mild to moderate bony foraminal narrowing at these levels. Upper chest: No acute abnormality. A 6 mm RIGHT apical pulmonary nodule (series 2: Image 76) is noted. A 2 cm RIGHT thyroid nodule is present. Other: None IMPRESSION: 1. No evidence of acute intracranial abnormality. Atrophy and probable chronic small-vessel white matter ischemic changes. 2. No static evidence of acute injury to the cervical spine. Degenerative changes as described. 3. 6 mm RIGHT apical pulmonary nodule. Non-contrast chest CT at 6-12 months is recommended. If the nodule is stable at time of repeat CT, then future CT at 18-24 months (from today's scan) is considered optional for low-risk patients, but is recommended for high-risk patients. This recommendation follows the consensus statement: Guidelines for Management of Incidental Pulmonary Nodules Detected on CT Images: From the Fleischner Society 2017; Radiology 2017; 284:228-243. 4. 2 cm RIGHT  thyroid nodule. Recommend thyroid US. (Ref: J Am Coll Radiol. 2015 Feb;12(2): 143-50). Electronically Signed   By: Margarette Canada M.D.   On: 02/04/2022 10:17  ? ?CT Cervical Spine Wo Contrast ? ?Result Date: 02/04/2022 ?CLINICAL DATA:  88 year old male with head and neck pain following fall. Initial encounter. EXAM: CT HEAD WITHOUT CONTRAST CT CERVICAL SPINE WITHOUT CONTRAST TECHNIQUE: Multidetector CT imaging of the head and cervical spine was performed following the standard protocol without intravenous contrast. Multiplanar CT image reconstructions of the cervical spine were also generated. RADIATION DOSE REDUCTION: This exam was performed according to the departmental dose-optimization program which includes automated exposure control, adjustment of the mA and/or kV according to patient size and/or use of iterative reconstruction technique. COMPARISON:  None. FINDINGS:  CT HEAD FINDINGS Brain: No evidence of acute infarction, hemorrhage, hydrocephalus, extra-axial collection or mass lesion/mass effect. Atrophy and probable chronic small-vessel white matter ischemic changes are note

## 2022-02-07 ENCOUNTER — Telehealth: Payer: Self-pay

## 2022-02-07 ENCOUNTER — Ambulatory Visit: Payer: Medicare Other | Admitting: Family Medicine

## 2022-02-07 ENCOUNTER — Ambulatory Visit: Payer: Self-pay | Admitting: *Deleted

## 2022-02-07 NOTE — Telephone Encounter (Signed)
3rd attempt to reach pt, left VM to call back. Pt has appt tomorrow. ?Routing to practice for providers review per [facility] protocol, 3 attempts. ?

## 2022-02-07 NOTE — Telephone Encounter (Signed)
2nd attempt to reach pt, left VM to call back to discuss symptoms. 

## 2022-02-07 NOTE — Telephone Encounter (Signed)
I returned call to wife, Joycelyn Schmid.  He came home from the hospital last night but he has not eaten anything.   Everything is giving him indigestion.  He is weak.  Everything tastes bad too.  Is there anything for indigestion he can take?   "He is skin and bones".   ? ?Has an appt tomorrow morning with Dr. Caryn Section scheduled. ? ?I left a voicemail to call back. ? ? ? ? ?

## 2022-02-07 NOTE — Telephone Encounter (Signed)
Transition Care Management Follow-up Telephone Call ?Date of discharge and from where: [place] 02-06-22 Dx: hypercalcemia ?How have you been since you were released from the hospital? Doing good  ?Any questions or concerns? No ? ?Items Reviewed: ?Did the pt receive and understand the discharge instructions provided? Yes  ?Medications obtained and verified? Yes  ?Other? No  ?Any new allergies since your discharge? No  ?Dietary orders reviewed? Yes ?Do you have support at home? Yes  ? ?Home Care and Equipment/Supplies: ?Were home health services ordered? Yes- PT/ OT ?If so, what is the name of the agency? [facility]   ?Has the agency set up a time to come to the patient's home? no ?Were any new equipment or medical supplies ordered?  Yes: Rolling walker  ?What is the [facility] of the medical supply agency? [facility]  ?Were you able to get the supplies/equipment? yes ?Do you have any questions related to the use of the equipment or supplies? No ? ?Functional Questionnaire: (I = Independent and D = Dependent) ?ADLs: I ? ?Bathing/Dressing- I ? ?Meal Prep- I ? ?Eating- I ? ?Maintaining continence- I ? ?Transferring/Ambulation- I ? ?Managing Meds- I ? ?Follow up appointments reviewed: ? ?PCP Hospital f/u appt confirmed? Yes  Scheduled to see Dr Caryn Section on 02-13-22 @ 11am. ?[facility] f/u appt confirmed? No . ?Are transportation arrangements needed? No  ?If their condition worsens, is the pt aware to call PCP or go to the Emergency Dept.? Yes ?Was the patient provided with contact information for the PCP's office or ED? Yes ?Was to pt encouraged to call back with questions or concerns? Yes  ?

## 2022-02-08 ENCOUNTER — Encounter: Payer: Self-pay | Admitting: Family Medicine

## 2022-02-08 ENCOUNTER — Telehealth: Payer: Self-pay

## 2022-02-08 ENCOUNTER — Ambulatory Visit (INDEPENDENT_AMBULATORY_CARE_PROVIDER_SITE_OTHER): Payer: Medicare Other | Admitting: Family Medicine

## 2022-02-08 DIAGNOSIS — T796XXA Traumatic ischemia of muscle, initial encounter: Secondary | ICD-10-CM

## 2022-02-08 DIAGNOSIS — R630 Anorexia: Secondary | ICD-10-CM | POA: Diagnosis not present

## 2022-02-08 DIAGNOSIS — R778 Other specified abnormalities of plasma proteins: Secondary | ICD-10-CM

## 2022-02-08 DIAGNOSIS — E860 Dehydration: Secondary | ICD-10-CM

## 2022-02-08 MED ORDER — CYPROHEPTADINE HCL 4 MG PO TABS: 4.0000 mg | ORAL_TABLET | Freq: Two times a day (BID) | ORAL | 0 refills | Status: DC

## 2022-02-08 MED ORDER — MIRTAZAPINE 7.5 MG PO TABS: 7.5000 mg | ORAL_TABLET | Freq: Every day | ORAL | 1 refills | Status: AC

## 2022-02-08 NOTE — Progress Notes (Signed)
? ? ?Virtual telephone visit ? ? ? ?Virtual Visit via Telephone Note  ? ?This visit type was conducted due to national recommendations for restrictions regarding the COVID-19 Pandemic (e.g. social distancing) in an effort to limit this patient's exposure and mitigate transmission in our community. Due to his co-morbid illnesses, this patient is at least at moderate risk for complications without adequate follow up. This format is felt to be most appropriate for this patient at this time. The patient did not have access to video technology or had technical difficulties with video requiring transitioning to audio format only (telephone). Physical exam was limited to content and character of the telephone converstion.   ? ?Patient location: home ?Provider location: [facility] ? ?I discussed the limitations of evaluation and management by telemedicine and the availability of in person appointments. The patient expressed understanding and agreed to proceed.  ? ?Visit Date: 02/08/2022 ? ?Today's healthcare provider: Lelon Huh, MD  ? ?Chief Complaint  ?Patient presents with  ? Hospitalization Follow-up  ? ?Subjective  ?  ?HPI  ?Follow up Hospitalization ? ?Patient was admitted to [facility] on 02/04/2022 and discharged on 02/06/2022. ?He was admitted to the hospital for generalized weakness leading to a fall at home.  Started after coming home from trip to beach in February. He was found to have rhabdomyolysis, dehydration, hypercalcemia and elevated troponins.  He was treated with IV fluids.  He also had hypokalemia and hypophosphatemia that were repleted. ?Per discharge summary, follow-up with PCP in 1 week. Recommend fine-needle aspiration of right thyroid nodule. Outpatient follow-up of right lung nodule with PCP is recommended ?Telephone follow up was done on 02/07/2022. ?He reports fair compliance with treatment.  ?He reports this condition is  slight improvement . Patient's wife reports that patient will not eat any food.  He will take one bite of food and then stop.Yesterday he took one spoonful  of apple sauce, one cracker, and a piece of cheese, 2 french fries and 3/4 bottle of 20 oz Gatorade and 1 cup of decaf coffee.  He feels stronger that when he was in the hospital, but is still pretty weak. ? ?Wife states that he was in his usual state of health until returning from a beach vacation in February, after which he felt unusually fatigued and lost his appetite. He became increasingly fatigued until his fall that led to recent hospital admission.  ? ?Home PT/OT were ordered, but hs not yet started ? ? ? ? ?----------------------------------------------------------------------------------------- -  ?  ? ?Medications: ?Outpatient Medications Prior to Visit  ?Medication Sig  ? amLODipine (NORVASC) 2.5 MG tablet TAKE 1 TABLET(2.5 MG) BY MOUTH DAILY  ? aspirin 81 MG tablet Take 1 tablet by mouth every other day.   ? co-enzyme Q-10 30 MG capsule Take 30 mg by mouth daily.  ? Emollient (OKEEFFES WORKING HANDS EX) Apply topically daily.  ? losartan-hydrochlorothiazide (HYZAAR) 100-25 MG tablet TAKE 1 TABLET BY MOUTH DAILY  ? lovastatin (MEVACOR) 20 MG tablet TAKE 1 TABLET(20 MG) BY MOUTH AT BEDTIME  ? Multiple Vitamin (MULTIVITAMIN PO) Take by mouth.  ? ?No facility-administered medications prior to visit.  ? ? ?Review of Systems  ?Constitutional:  Positive for activity change, appetite change and fatigue. Negative for chills and fever.  ?Respiratory:  Negative for chest tightness, shortness of breath and wheezing.   ?Cardiovascular:  Negative for chest pain and palpitations.  ?Gastrointestinal:  Negative for abdominal pain, nausea and vomiting.  ?Neurological:  Positive for weakness.  ? ? ? ?  Objective  ?  ?There were no vitals taken for this visit. ? ? ?Awake, alert, oriented x 3. In no apparent distress ? ? Assessment & Plan  ?  ? ?1. Hypercalcemia ? ?- Vitamin D (25 hydroxy) ?- Parathyroid hormone, intact (no Ca) ? ?2. Anorexia ?May be  some underlying depression, start- mirtazapine (REMERON) 7.5 MG tablet; Take 1 tablet (7.5 mg total) by mouth at bedtime.  Dispense: 30 tablet; Refill: 1 ?- cyproheptadine (PERIACTIN) 4 MG tablet; Take 1 tablet (4 mg total) by mouth 2 (two) times daily with a meal.  Dispense: 30 tablet; Refill: 0 ?- Comprehensive metabolic panel ?- TSH ? ?3. Traumatic rhabdomyolysis, initial encounter [facility]) ? ?- CK (Creatine Kinase) ? ?4. Elevated troponin ? ?- Troponin T ? ?5. Dehydration ?Encourage abundant fluid intake.   ? ?  ? ?I discussed the assessment and treatment plan with the patient. The patient was provided an opportunity to ask questions and all were answered. The patient agreed with the plan and demonstrated an understanding of the instructions. ?  ?The patient was advised to call back or seek an in-person evaluation if the symptoms worsen or if the condition fails to improve as anticipated. ? ?I provided 12 minutes of non-face-to-face time during this encounter. ? ?The entirety of the information documented in the History of Present Illness, Review of Systems and Physical Exam were personally obtained by me. Portions of this information were initially documented by the CMA and reviewed by me for thoroughness and accuracy.   ? ?Lelon Huh, MD ?[facility] ?[phone] (phone) ?[phone] (fax) ? ?[facility]   ?

## 2022-02-08 NOTE — Telephone Encounter (Signed)
Caryn Section is out of the office this afternoon. Please advise if ok to give verbal order for OT and PT tomorrow.  ?

## 2022-02-08 NOTE — Telephone Encounter (Signed)
Verbal orders given to [place] with [facility].  ?

## 2022-02-08 NOTE — Telephone Encounter (Signed)
Copied from [place] [phone]. Topic: Quick Communication - Home Health Verbal Orders ?>> Feb 08, 2022  3:08 PM Pawlus, Brayton Layman A wrote: ?Caller/Agency: [facility]  ?Callback Number: [phone] option 2  ?Requesting to start OT and PT tomorrow 02/09/22 ?

## 2022-02-09 ENCOUNTER — Telehealth: Payer: Self-pay | Admitting: *Deleted

## 2022-02-09 DIAGNOSIS — I10 Essential (primary) hypertension: Secondary | ICD-10-CM | POA: Diagnosis not present

## 2022-02-09 DIAGNOSIS — T796XXD Traumatic ischemia of muscle, subsequent encounter: Secondary | ICD-10-CM | POA: Diagnosis not present

## 2022-02-09 DIAGNOSIS — D696 Thrombocytopenia, unspecified: Secondary | ICD-10-CM | POA: Diagnosis not present

## 2022-02-09 DIAGNOSIS — Z7982 Long term (current) use of aspirin: Secondary | ICD-10-CM | POA: Diagnosis not present

## 2022-02-09 DIAGNOSIS — Z9181 History of falling: Secondary | ICD-10-CM | POA: Diagnosis not present

## 2022-02-09 DIAGNOSIS — E785 Hyperlipidemia, unspecified: Secondary | ICD-10-CM | POA: Diagnosis not present

## 2022-02-09 DIAGNOSIS — Z87891 Personal history of nicotine dependence: Secondary | ICD-10-CM | POA: Diagnosis not present

## 2022-02-09 DIAGNOSIS — E876 Hypokalemia: Secondary | ICD-10-CM | POA: Diagnosis not present

## 2022-02-09 DIAGNOSIS — I2510 Atherosclerotic heart disease of native coronary artery without angina pectoris: Secondary | ICD-10-CM | POA: Diagnosis not present

## 2022-02-09 NOTE — Telephone Encounter (Signed)
Whichever company is doing his PT should also have  home nurse services. Will need to call the agency and see if they can add service.  ?

## 2022-02-09 NOTE — Telephone Encounter (Signed)
Copied from [place] [id number]. Topic: General - Other ?>> Feb 09, 2022 10:27 AM McGill, Nelva Bush wrote: ?Reason for CRM: Pt wife is calling, asking if someone can come out to pt home and draw his blood for the labs that his PCP wanted. ? ?Pt wife stated she just cannot bring him right now, and he is getting around in his walker and is unsure how to manager to bring him in.? ? ?Pt wife is requesting a call back. ?

## 2022-02-09 NOTE — Telephone Encounter (Signed)
I called patient's wife and asked if they currently have a home health nurse. She says patient does not have a home health nurse. He only has a home health physical therapist. Patients wife states she cant bring him into the office for labs and she needs someone to come into the home to do his blood work. Please advise on any recommendations. ?

## 2022-02-09 NOTE — Telephone Encounter (Signed)
LM advises pt.  ?

## 2022-02-09 NOTE — Telephone Encounter (Signed)
Patient uses [facility]. I called this home health agency and asked if we could add  nursing to patients current home health services. I was advised that there has to be another need for nursing other than needing blood work drawn. Does patient have any other needs that requires nursing such as patient teaching, medication management, etc. Per home health agency, home health nursing service is not covered for them to just draw labs. If there is another need for home health nursing, we can call them back and speak with Tiffany to request add on service along with the need for service. Please advise if there is another need.  ? ?[facility] call back:  [phone]  ?

## 2022-02-10 ENCOUNTER — Telehealth: Payer: Self-pay

## 2022-02-10 DIAGNOSIS — R778 Other specified abnormalities of plasma proteins: Secondary | ICD-10-CM | POA: Diagnosis not present

## 2022-02-10 DIAGNOSIS — R630 Anorexia: Secondary | ICD-10-CM | POA: Diagnosis not present

## 2022-02-10 DIAGNOSIS — T796XXA Traumatic ischemia of muscle, initial encounter: Secondary | ICD-10-CM | POA: Diagnosis not present

## 2022-02-10 NOTE — Telephone Encounter (Signed)
Patient's wife, Joycelyn Schmid is asking for Dr. Maralyn Sago nurse to call her back.  She would not specify or release why she needed to talk to her except that it was about Mr. Letts.

## 2022-02-10 NOTE — Telephone Encounter (Signed)
How bout medication management, labile blood pressure on multiple blood pressure medications ?

## 2022-02-11 LAB — PARATHYROID HORMONE, INTACT (NO CA): PTH: 8 pg/mL — ABNORMAL LOW (ref 15–65)

## 2022-02-11 LAB — COMPREHENSIVE METABOLIC PANEL
ALT: 51 IU/L — ABNORMAL HIGH (ref 0–44)
AST: 69 IU/L — ABNORMAL HIGH (ref 0–40)
Albumin/Globulin Ratio: 1.8 (ref 1.2–2.2)
Albumin: 4 g/dL (ref 3.6–4.6)
Alkaline Phosphatase: 113 IU/L (ref 44–121)
BUN/Creatinine Ratio: 36 — ABNORMAL HIGH (ref 10–24)
BUN: 38 mg/dL — ABNORMAL HIGH (ref 8–27)
Bilirubin Total: 0.9 mg/dL (ref 0.0–1.2)
CO2: 27 mmol/L (ref 20–29)
Calcium: 12.2 mg/dL — ABNORMAL HIGH (ref 8.6–10.2)
Chloride: 101 mmol/L (ref 96–106)
Creatinine, Ser: 1.06 mg/dL (ref 0.76–1.27)
Globulin, Total: 2.2 g/dL (ref 1.5–4.5)
Glucose: 109 mg/dL — ABNORMAL HIGH (ref 70–99)
Potassium: 3.9 mmol/L (ref 3.5–5.2)
Sodium: 145 mmol/L — ABNORMAL HIGH (ref 134–144)
Total Protein: 6.2 g/dL (ref 6.0–8.5)
eGFR: 67 mL/min/{1.73_m2} (ref >59)

## 2022-02-11 LAB — TROPONIN T: Troponin T (Highly Sensitive): 280 ng/L (ref 0–22)

## 2022-02-11 LAB — TSH: TSH: 4.87 u[IU]/mL — ABNORMAL HIGH (ref 0.450–4.500)

## 2022-02-11 LAB — VITAMIN D 25 HYDROXY (VIT D DEFICIENCY, FRACTURES): Vit D, 25-Hydroxy: 81.4 ng/mL (ref 30.0–100.0)

## 2022-02-11 LAB — CK: Total CK: 92 U/L (ref 30–208)

## 2022-02-13 ENCOUNTER — Ambulatory Visit (INDEPENDENT_AMBULATORY_CARE_PROVIDER_SITE_OTHER): Payer: Medicare Other | Admitting: Family Medicine

## 2022-02-13 ENCOUNTER — Telehealth: Payer: Self-pay

## 2022-02-13 ENCOUNTER — Encounter: Payer: Self-pay | Admitting: Family Medicine

## 2022-02-13 VITALS — BP 110/49 | HR 89 | Temp 97.7°F | Resp 12 | Wt 146.5 lb

## 2022-02-13 DIAGNOSIS — I2510 Atherosclerotic heart disease of native coronary artery without angina pectoris: Secondary | ICD-10-CM

## 2022-02-13 DIAGNOSIS — Z125 Encounter for screening for malignant neoplasm of prostate: Secondary | ICD-10-CM

## 2022-02-13 DIAGNOSIS — R630 Anorexia: Secondary | ICD-10-CM

## 2022-02-13 DIAGNOSIS — R7989 Other specified abnormal findings of blood chemistry: Secondary | ICD-10-CM

## 2022-02-13 DIAGNOSIS — R634 Abnormal weight loss: Secondary | ICD-10-CM

## 2022-02-13 DIAGNOSIS — K921 Melena: Secondary | ICD-10-CM

## 2022-02-13 NOTE — Telephone Encounter (Signed)
I returned Benjamin Walls's call and left message on his secure voice message system that its ok for verbal orders.  ?

## 2022-02-13 NOTE — Telephone Encounter (Signed)
That's fine

## 2022-02-13 NOTE — Telephone Encounter (Signed)
Patient was able to get labs done Friday. He has an appointment to come in today at 11am. Please advise if you still want to add home health nursing services for medication management.  ?

## 2022-02-13 NOTE — Progress Notes (Signed)
?  ? ?I,Jana Robinson,acting as a scribe for Lelon Huh, MD.,have documented all relevant documentation on the behalf of Lelon Huh, MD,as directed by  Lelon Huh, MD while in the presence of Lelon Huh, MD. ? ? ?Established patient visit ? ? ?Patient: Benjamin Walls   DOB: Jul 10, 1930   88 y.o. Male  MRN: [id number] ?Visit Date: 02/13/2022 ? ?Today's healthcare provider: Lelon Huh, MD  ? ?Chief Complaint  ?Patient presents with  ? Follow-up  ?  Hypercalcemia, anorexia   ? ?Subjective  ?  ?Follow up for anorexia ? ? ?The patient was last seen for this 5 days ago. ?Changes made at last visit include stating mirtazapine (REMERON) 7.5 MG tablet; Take 1 tablet (7.5 mg total) by mouth at bedtime and cyproheptadine (PERIACTIN) 4 MG tablet; Take 1 tablet (4 mg total) by mouth 2 (two) times daily with a meal.  ? ?He reports excellent compliance with treatment. ?He feels that condition is Unchanged. ?He is not having side effects.  ?Per patient's wife patient cannot swallow, no taste to food. ? As per televisit note from 02/08/2022, patient was in his usual state of health until returning from beach trip in February after which he develop progressive loss of appetite and weakness. He was hospitalized with rhabdomyolysis and secondary troponin elevated after fall on 4/1/ 2023 ? ?Wt Readings from Last 3 Encounters:  ?02/13/22 146 lb 8 oz (66.5 kg)  ?11/22/21 162 lb 12.8 oz (73.8 kg)  ? ? ? ?-----------------------------------------------------------------------------------------  ?Follow up for hypercalcemia  ? ?The patient was last seen for this 5 days ago. ?Labs ordered during visit. Patient here to discuss results.  ? ?He reports excellent compliance with treatment. ?He feels that condition is Unchanged. ?He is not having side effects. None  ? ?No visits with results within 4 Day(s) from this visit.  ?Latest known visit with results is:  ?Office Visit on 02/08/2022  ?Component Date Value Ref Range Status  ?  Glucose 02/10/2022 109 (H)  70 - 99 mg/dL Final  ? BUN 02/10/2022 38 (H)  8 - 27 mg/dL Final  ? Creatinine, Ser 02/10/2022 1.06  0.76 - 1.27 mg/dL Final  ? eGFR 02/10/2022 67  >59 mL/min/1.73 Final  ? BUN/Creatinine Ratio 02/10/2022 36 (H)  10 - 24 Final  ? Sodium 02/10/2022 145 (H)  134 - 144 mmol/L Final  ? Potassium 02/10/2022 3.9  3.5 - 5.2 mmol/L Final  ? Chloride 02/10/2022 101  96 - 106 mmol/L Final  ? CO2 02/10/2022 27  20 - 29 mmol/L Final  ? Calcium 02/10/2022 12.2 (H)  8.6 - 10.2 mg/dL Final  ? Total Protein 02/10/2022 6.2  6.0 - 8.5 g/dL Final  ? Albumin 02/10/2022 4.0  3.6 - 4.6 g/dL Final  ? Globulin, Total 02/10/2022 2.2  1.5 - 4.5 g/dL Final  ? Albumin/Globulin Ratio 02/10/2022 1.8  1.2 - 2.2 Final  ? Bilirubin Total 02/10/2022 0.9  0.0 - 1.2 mg/dL Final  ? Alkaline Phosphatase 02/10/2022 113  44 - 121 IU/L Final  ? AST 02/10/2022 69 (H)  0 - 40 IU/L Final  ? ALT 02/10/2022 51 (H)  0 - 44 IU/L Final  ? TSH 02/10/2022 4.870 (H)  0.450 - 4.500 uIU/mL Final  ? Troponin T (Highly Sensitive) 02/10/2022 280 (HH)  0 - 22 ng/L Final  ? Total CK 02/10/2022 92  30 - 208 U/L Final  ? Vit D, 25-Hydroxy 02/10/2022 81.4  30.0 - 100.0 ng/mL Final  ? PTH 02/10/2022  8 (L)  15 - 65 pg/mL Final  ? ? ?-----------------------------------------------------------------------------------------  ? ?Medications: ?Outpatient Medications Prior to Visit  ?Medication Sig  ? amLODipine (NORVASC) 2.5 MG tablet TAKE 1 TABLET(2.5 MG) BY MOUTH DAILY  ? aspirin 81 MG tablet Take 1 tablet by mouth every other day.   ? co-enzyme Q-10 30 MG capsule Take 30 mg by mouth daily.  ? cyproheptadine (PERIACTIN) 4 MG tablet Take 1 tablet (4 mg total) by mouth 2 (two) times daily with a meal.  ? Emollient (OKEEFFES WORKING HANDS EX) Apply topically daily.  ? losartan-hydrochlorothiazide (HYZAAR) 100-25 MG tablet TAKE 1 TABLET BY MOUTH DAILY  ? lovastatin (MEVACOR) 20 MG tablet TAKE 1 TABLET(20 MG) BY MOUTH AT BEDTIME  ? mirtazapine (REMERON) 7.5  MG tablet Take 1 tablet (7.5 mg total) by mouth at bedtime.  ? Multiple Vitamin (MULTIVITAMIN PO) Take by mouth.  ? ?No facility-administered medications prior to visit.  ? ? ?Review of Systems ? ? ?  Objective  ?  ?BP (!) 110/49 (BP Location: Right Arm, Patient Position: Sitting, Cuff Size: Normal)   Pulse 89   Temp 97.7 ?F (36.5 ?C) (Oral)   Resp 12   Wt 146 lb 8 oz (66.5 kg)   SpO2 95%   BMI 19.33 kg/m?  ? ? ?Physical Exam  ? ?General: Appearance:    Thin male in no acute distress  ?Eyes:    PERRL, conjunctiva/corneas clear, EOM's intact       ?Lungs:     Clear to auscultation bilaterally, respirations unlabored  ?Heart:    Normal heart rate. Normal rhythm.  ?3/6 systolic murmur at right upper sternal border   ?MS:   All extremities are intact.    ?Neurologic:   Awake, alert, oriented x 3. No apparent focal neurological defect.   ?   ?  ? Assessment & Plan  ?  ? ?1. Abnormal weight loss ? ?- PSA ?- Amylase ?- Lipase ?- Comprehensive metabolic panel ?- CT ABDOMEN W WO CONTRAST; Future ? ?2. Anorexia ? ?- Amylase ?- Lipase ?- CT ABDOMEN W WO CONTRAST; Future ? ?3. Melena ? ? ?4. Low serum parathyroid hormone (PTH) ? ?- Protein Electrophoresis, (serum) ?- Vitamin A ? ?5. Hypercalcemia ? ?- Vitamin A ?- PSA ?- Comprehensive metabolic panel ? ?6. Prostate cancer screening ? ?- PSA   ?   ? ?The entirety of the information documented in the History of Present Illness, Review of Systems and Physical Exam were personally obtained by me. Portions of this information were initially documented by the CMA and reviewed by me for thoroughness and accuracy.   ? ? ?Lelon Huh, MD  ?[facility] ?[phone] (phone) ?[phone] (fax) ? ?[facility] ?

## 2022-02-13 NOTE — Telephone Encounter (Signed)
No, can cancel home health nursing.  ?

## 2022-02-13 NOTE — Telephone Encounter (Signed)
Copied from [place] [id number]. Topic: Quick Communication - Home Health Verbal Orders ?>> Feb 13, 2022 11:49 AM Pawlus, Brayton Layman A wrote: ?Caller/Agency: Percell [facility] PT ?Callback Number: [phone] ?Requesting: PT ?Frequency: 2x4, 1x4 ?

## 2022-02-14 ENCOUNTER — Ambulatory Visit: Payer: Self-pay | Admitting: *Deleted

## 2022-02-14 DIAGNOSIS — I2510 Atherosclerotic heart disease of native coronary artery without angina pectoris: Secondary | ICD-10-CM | POA: Diagnosis not present

## 2022-02-14 DIAGNOSIS — I10 Essential (primary) hypertension: Secondary | ICD-10-CM | POA: Diagnosis not present

## 2022-02-14 DIAGNOSIS — Z125 Encounter for screening for malignant neoplasm of prostate: Secondary | ICD-10-CM | POA: Diagnosis not present

## 2022-02-14 DIAGNOSIS — R634 Abnormal weight loss: Secondary | ICD-10-CM | POA: Diagnosis not present

## 2022-02-14 DIAGNOSIS — E785 Hyperlipidemia, unspecified: Secondary | ICD-10-CM | POA: Diagnosis not present

## 2022-02-14 DIAGNOSIS — R972 Elevated prostate specific antigen [PSA]: Secondary | ICD-10-CM | POA: Diagnosis not present

## 2022-02-14 DIAGNOSIS — K921 Melena: Secondary | ICD-10-CM | POA: Diagnosis not present

## 2022-02-14 DIAGNOSIS — T796XXD Traumatic ischemia of muscle, subsequent encounter: Secondary | ICD-10-CM | POA: Diagnosis not present

## 2022-02-14 DIAGNOSIS — E876 Hypokalemia: Secondary | ICD-10-CM | POA: Diagnosis not present

## 2022-02-14 NOTE — Telephone Encounter (Signed)
?  Chief Complaint: worsening weakness, patient not eating or drinking per patient's wife  ?Symptoms: worsening mobility, unable to take medications, not swallowing and not eating or drinking since leaving hospital 3 weeks ago  ?Frequency: 3 weeks ago  ?Pertinent Negatives: Patient denies na  ?Disposition: '[x]'$ ED /'[]'$ Urgent Care (no appt availability in office) / '[]'$ Appointment(In office/virtual)/ '[]'$  [facility]/ '[]'$ Home Care/ '[]'$ Refused Recommended Disposition /'[]'$ [place] [facility]/ '[x]'$  Follow-up with PCP ?Additional Notes:  ? ?Patient's wife would like to know what to do next with patient. Requesting to find out what is wrong with him and make sure patient can have CT on Friday. Recommended ED since patient unable to eat or drink. Patient's wife would like a call back from PCP to discuss options.  ? ? Reason for Disposition ? [1] Drinking very little AND [2] dehydration suspected (e.g., no urine > 12 hours, very dry mouth, very lightheaded) ? ?Answer Assessment - Initial Assessment Questions ?1. DESCRIPTION: "Describe how you are feeling." ?    Weakness with mobility not eating or drinking ?2. SEVERITY: "How bad is it?"  "Can you stand and walk?" ?  - MILD - Feels weak or tired, but does not interfere with work, school or normal activities ?  - MODERATE - Able to stand and walk; weakness interferes with work, school, or normal activities ?  - SEVERE - Unable to stand or walk ?    Difficulty using walker  ?3. ONSET:  "When did the weakness begin?" ?    Worsening x 3 weeks  ?4. CAUSE: "What do you think is causing the weakness?" ?    Not sure  ?5. MEDICINES: "Have you recently started a new medicine or had a change in the amount of a medicine?" ?    na ?6. OTHER SYMPTOMS: "Do you have any other symptoms?" (e.g., chest pain, fever, cough, SOB, vomiting, diarrhea, bleeding, other areas of pain) ?    Weakness not eating or drinking and difficulty taking medications  ?7. PREGNANCY: "Is there any chance you  are pregnant?" "When was your last menstrual period?" ?    na ? ?Protocols used: Weakness (Generalized) and Fatigue-A-AH ? ?

## 2022-02-15 ENCOUNTER — Inpatient Hospital Stay
Admission: EM | Admit: 2022-02-15 | Discharge: 2022-02-17 | DRG: 176 | Disposition: A | Discharge status: Hospice | Payer: Medicare Other | Attending: Family Medicine | Admitting: Family Medicine

## 2022-02-15 ENCOUNTER — Emergency Department: Payer: Medicare Other

## 2022-02-15 ENCOUNTER — Other Ambulatory Visit: Payer: Self-pay

## 2022-02-15 ENCOUNTER — Ambulatory Visit: Payer: Self-pay | Admitting: *Deleted

## 2022-02-15 DIAGNOSIS — Z79899 Other long term (current) drug therapy: Secondary | ICD-10-CM | POA: Diagnosis not present

## 2022-02-15 DIAGNOSIS — Z681 Body mass index (BMI) 19 or less, adult: Secondary | ICD-10-CM

## 2022-02-15 DIAGNOSIS — K8689 Other specified diseases of pancreas: Secondary | ICD-10-CM

## 2022-02-15 DIAGNOSIS — K8070 Calculus of gallbladder and bile duct without cholecystitis without obstruction: Secondary | ICD-10-CM | POA: Diagnosis present

## 2022-02-15 DIAGNOSIS — Z515 Encounter for palliative care: Secondary | ICD-10-CM | POA: Diagnosis not present

## 2022-02-15 DIAGNOSIS — Z8249 Family history of ischemic heart disease and other diseases of the circulatory system: Secondary | ICD-10-CM | POA: Diagnosis not present

## 2022-02-15 DIAGNOSIS — C786 Secondary malignant neoplasm of retroperitoneum and peritoneum: Secondary | ICD-10-CM | POA: Diagnosis not present

## 2022-02-15 DIAGNOSIS — I2510 Atherosclerotic heart disease of native coronary artery without angina pectoris: Secondary | ICD-10-CM | POA: Diagnosis present

## 2022-02-15 DIAGNOSIS — R638 Other symptoms and signs concerning food and fluid intake: Secondary | ICD-10-CM

## 2022-02-15 DIAGNOSIS — C772 Secondary and unspecified malignant neoplasm of intra-abdominal lymph nodes: Secondary | ICD-10-CM | POA: Diagnosis not present

## 2022-02-15 DIAGNOSIS — C762 Malignant neoplasm of abdomen: Secondary | ICD-10-CM

## 2022-02-15 DIAGNOSIS — R778 Other specified abnormalities of plasma proteins: Secondary | ICD-10-CM | POA: Diagnosis present

## 2022-02-15 DIAGNOSIS — I10 Essential (primary) hypertension: Secondary | ICD-10-CM | POA: Diagnosis present

## 2022-02-15 DIAGNOSIS — E46 Unspecified protein-calorie malnutrition: Secondary | ICD-10-CM

## 2022-02-15 DIAGNOSIS — E876 Hypokalemia: Secondary | ICD-10-CM | POA: Diagnosis present

## 2022-02-15 DIAGNOSIS — Z7982 Long term (current) use of aspirin: Secondary | ICD-10-CM

## 2022-02-15 DIAGNOSIS — E785 Hyperlipidemia, unspecified: Secondary | ICD-10-CM | POA: Diagnosis present

## 2022-02-15 DIAGNOSIS — I248 Other forms of acute ischemic heart disease: Secondary | ICD-10-CM | POA: Diagnosis present

## 2022-02-15 DIAGNOSIS — K921 Melena: Secondary | ICD-10-CM | POA: Diagnosis not present

## 2022-02-15 DIAGNOSIS — K029 Dental caries, unspecified: Secondary | ICD-10-CM | POA: Diagnosis not present

## 2022-02-15 DIAGNOSIS — K8050 Calculus of bile duct without cholangitis or cholecystitis without obstruction: Secondary | ICD-10-CM | POA: Diagnosis present

## 2022-02-15 DIAGNOSIS — R634 Abnormal weight loss: Secondary | ICD-10-CM | POA: Diagnosis not present

## 2022-02-15 DIAGNOSIS — Z87891 Personal history of nicotine dependence: Secondary | ICD-10-CM | POA: Diagnosis not present

## 2022-02-15 DIAGNOSIS — M47812 Spondylosis without myelopathy or radiculopathy, cervical region: Secondary | ICD-10-CM | POA: Diagnosis not present

## 2022-02-15 DIAGNOSIS — R64 Cachexia: Secondary | ICD-10-CM | POA: Diagnosis present

## 2022-02-15 DIAGNOSIS — Z66 Do not resuscitate: Secondary | ICD-10-CM | POA: Diagnosis present

## 2022-02-15 DIAGNOSIS — R9431 Abnormal electrocardiogram [ECG] [EKG]: Secondary | ICD-10-CM | POA: Diagnosis not present

## 2022-02-15 DIAGNOSIS — R531 Weakness: Secondary | ICD-10-CM

## 2022-02-15 DIAGNOSIS — I2699 Other pulmonary embolism without acute cor pulmonale: Secondary | ICD-10-CM | POA: Diagnosis present

## 2022-02-15 DIAGNOSIS — I2694 Multiple subsegmental pulmonary emboli without acute cor pulmonale: Secondary | ICD-10-CM | POA: Diagnosis not present

## 2022-02-15 DIAGNOSIS — K4090 Unilateral inguinal hernia, without obstruction or gangrene, not specified as recurrent: Secondary | ICD-10-CM | POA: Diagnosis not present

## 2022-02-15 DIAGNOSIS — K838 Other specified diseases of biliary tract: Secondary | ICD-10-CM | POA: Diagnosis not present

## 2022-02-15 DIAGNOSIS — R590 Localized enlarged lymph nodes: Secondary | ICD-10-CM

## 2022-02-15 DIAGNOSIS — R7989 Other specified abnormal findings of blood chemistry: Secondary | ICD-10-CM | POA: Diagnosis present

## 2022-02-15 DIAGNOSIS — E041 Nontoxic single thyroid nodule: Secondary | ICD-10-CM | POA: Diagnosis not present

## 2022-02-15 DIAGNOSIS — K8020 Calculus of gallbladder without cholecystitis without obstruction: Secondary | ICD-10-CM | POA: Diagnosis not present

## 2022-02-15 DIAGNOSIS — N179 Acute kidney failure, unspecified: Secondary | ICD-10-CM

## 2022-02-15 DIAGNOSIS — I672 Cerebral atherosclerosis: Secondary | ICD-10-CM | POA: Diagnosis not present

## 2022-02-15 LAB — SAMPLE TO BLOOD BANK

## 2022-02-15 LAB — BASIC METABOLIC PANEL
Anion gap: 14 (ref 5–15)
BUN: 62 mg/dL — ABNORMAL HIGH (ref 8–23)
CO2: 28 mmol/L (ref 22–32)
Calcium: 12.6 mg/dL — ABNORMAL HIGH (ref 8.9–10.3)
Chloride: 102 mmol/L (ref 98–111)
Creatinine, Ser: 1.12 mg/dL (ref 0.61–1.24)
GFR, Estimated: >60 mL/min (ref >60)
Glucose, Bld: 110 mg/dL — ABNORMAL HIGH (ref 70–99)
Potassium: 3.3 mmol/L — ABNORMAL LOW (ref 3.5–5.1)
Sodium: 144 mmol/L (ref 135–145)

## 2022-02-15 LAB — HEPATIC FUNCTION PANEL
ALT: 35 U/L (ref 0–44)
AST: 52 U/L — ABNORMAL HIGH (ref 15–41)
Albumin: 3.7 g/dL (ref 3.5–5.0)
Alkaline Phosphatase: 93 U/L (ref 38–126)
Bilirubin, Direct: 0.3 mg/dL — ABNORMAL HIGH (ref 0.0–0.2)
Indirect Bilirubin: 1.3 mg/dL — ABNORMAL HIGH (ref 0.3–0.9)
Total Bilirubin: 1.6 mg/dL — ABNORMAL HIGH (ref 0.3–1.2)
Total Protein: 7.5 g/dL (ref 6.5–8.1)

## 2022-02-15 LAB — CBC
HCT: 37.2 % — ABNORMAL LOW (ref 39.0–52.0)
Hemoglobin: 12.3 g/dL — ABNORMAL LOW (ref 13.0–17.0)
MCH: 30.2 pg (ref 26.0–34.0)
MCHC: 33.1 g/dL (ref 30.0–36.0)
MCV: 91.4 fL (ref 80.0–100.0)
Platelets: 177 10*3/uL (ref 150–400)
RBC: 4.07 MIL/uL — ABNORMAL LOW (ref 4.22–5.81)
RDW: 14.2 % (ref 11.5–15.5)
WBC: 7.1 10*3/uL (ref 4.0–10.5)
nRBC: 0 % (ref 0.0–0.2)

## 2022-02-15 LAB — TYPE AND SCREEN
ABO/RH(D): A NEG
Antibody Screen: NEGATIVE

## 2022-02-15 LAB — TSH: TSH: 7.421 u[IU]/mL — ABNORMAL HIGH (ref 0.350–4.500)

## 2022-02-15 LAB — TROPONIN I (HIGH SENSITIVITY)
Troponin I (High Sensitivity): 91 ng/L — ABNORMAL HIGH (ref <18)
Troponin I (High Sensitivity): 91 ng/L — ABNORMAL HIGH (ref <18)

## 2022-02-15 MED ORDER — IOHEXOL 300 MG/ML  SOLN
100.0000 mL | Freq: Once | INTRAMUSCULAR | Status: AC | PRN
  Administered 2022-02-15: 100 mL via INTRAVENOUS

## 2022-02-15 MED ORDER — SODIUM CHLORIDE 0.9 % IV BOLUS
1000.0000 mL | Freq: Once | INTRAVENOUS | Status: AC
Start: 2022-02-15 — End: 2022-02-16
  Administered 2022-02-15: 1000 mL via INTRAVENOUS

## 2022-02-15 NOTE — Assessment & Plan Note (Addendum)
Oncology is consulted.   ?Recommended right inguinal lymph node biopsy by IR. ?Patient refusing all procedures. ?

## 2022-02-15 NOTE — ED Provider Triage Note (Signed)
Emergency Medicine Provider Triage Evaluation Note ? ?Benjamin Walls , a 88 y.o. male  was evaluated in triage.  Pt complains of generalized weakness over the last 2 months.  Patient with no appetite and losing weight. ? ?Review of Systems  ?Positive: Dark stools per family ?Negative: No nausea, vomiting or diarrhea. ? ?Physical Exam  ?BP 124/61   Pulse 81   Resp 18   Wt 66 kg   SpO2 100%   BMI 19.20 kg/m?  ?Gen:   Awake, no distress.  ?Resp:  Normal effort  ?MSK:   Moves extremities without difficulty  ?Other:   ? ?Medical Decision Making  ?Medically screening exam initiated at 2:30 PM.  Appropriate orders placed.  Benjamin Walls was informed that the remainder of the evaluation will be completed by another provider, this initial triage assessment does not replace that evaluation, and the importance of remaining in the ED until their evaluation is complete. ? ? ?  ?Johnn Hai, PA-C ?02/15/22 1440 ? ?

## 2022-02-15 NOTE — Assessment & Plan Note (Addendum)
CTA chest shows subsegmental PE in right upper lobe and with right heart strain. ?Patient was at high risk for PE given suspected malignancy but in the setting of melena, we will hold off on systemic anticoagulation at this time.  We will continue to monitor H&H and consider heparin if H&H remained stable. ?Patient having no overt symptoms of PE such as tachycardia, shortness of breath or hypoxia. ?

## 2022-02-15 NOTE — Assessment & Plan Note (Signed)
Secondary to suspected malignancy ?IV hydration ?

## 2022-02-15 NOTE — Assessment & Plan Note (Addendum)
History of ERCP in 2010 with sphincterotomy and pancreatic stent placement.  No prior cholecystectomy. ?

## 2022-02-15 NOTE — Telephone Encounter (Signed)
?  Chief Complaint: wife reports patient noted with blood in stool and wants [facility] notified of patient's arrival ?Symptoms: blood in stool , dizziness and unable to walk today , not eating or drinking . ?Frequency: na  ?Pertinent Negatives: Patient denies na ?Disposition: '[x]'$ ED /'[]'$ Urgent Care (no appt availability in office) / '[]'$ Appointment(In office/virtual)/ '[]'$  [facility]/ '[]'$ Home Care/ '[]'$ Refused Recommended Disposition /'[]'$ [facility]/ '[]'$  Follow-up with PCP ?Additional Notes:  ? ?Wife reports son will take patient to ED . She does not want 911 notified. [facility] ED notified patient will be coming within the hour per wife request.  ? ? ? Reason for Disposition ? SEVERE dizziness (e.g., unable to stand, requires support to walk, feels like passing out now) ? ?Answer Assessment - Initial Assessment Questions ?1. APPEARANCE of BLOOD: "What color is it?" "Is it passed separately, on the surface of the stool, or mixed in with the stool?"  ?    Na  ?2. AMOUNT: "How much blood was passed?"  ?    na ?3. FREQUENCY: "How many times has blood been passed with the stools?"  ?    Wife reports blood in stool  ?4. ONSET: "When was the blood first seen in the stools?" (Days or weeks)  ?    Weeks ago  ?5. DIARRHEA: "Is there also some diarrhea?" If Yes, ask: "How many diarrhea stools in the past 24 hours?"  ?    na ?6. CONSTIPATION: "Do you have constipation?" If Yes, ask: "How bad is it?" ?    na ?7. RECURRENT SYMPTOMS: "Have you had blood in your stools before?" If Yes, ask: "When was the last time?" and "What happened that time?"  ?    Yes  ?8. BLOOD THINNERS: "Do you take any blood thinners?" (e.g., Coumadin/warfarin, Pradaxa/dabigatran, aspirin) ?    na ?9. OTHER SYMPTOMS: "Do you have any other symptoms?"  (e.g., abdomen pain, vomiting, dizziness, fever) ?    Dizziness unable to walk, not eating or drinking  ?10. PREGNANCY: "Is there any chance you are pregnant?" "When was your last menstrual period?" ?       na ? ?Protocols used: Rectal Bleeding-A-AH ? ?

## 2022-02-15 NOTE — H&P (Signed)
?History and Physical  ? ? ?Patient: Benjamin Walls NID:[id number] DOB: [birth year] ?DOA: 02/15/2022 ?DOS: the patient was seen and examined on 02/15/2022 ?PCP: Birdie Sons, MD  ?Patient coming from: Home ? ?Chief Complaint:  ?Chief Complaint  ?Patient presents with  ? Rectal Bleeding  ? Weakness  ? ? ?HPI: Benjamin Walls is a 88 y.o. male with medical history significant for CAD, HTN, HLD, choledocholithiasis with prior sphincterotomy pancreatic stent in 2010 hospitalized from 4/1 to 02/06/2022 with a fall, with work-up concerning for hypercalcemia, rhabdomyolysis, troponinemia in the 2000's secondary to dehydration and demand ischemia, treated with hydration and discharged home with home health, who was brought back to the emergency room due to dark stools, progressive weakness and continued poor oral intake of food, liquids and difficulty swallowing medications.  He has had no nausea, vomiting, abdominal pain or diarrhea or fever and chills and no cough or shortness of breath. ?ED course: On arrival vitals within normal limits.  Labs significant for normal WBC, hemoglobin 12.3,, improved from 11.5 a week prior, calcium 12.6, BUN/creatinine of 62/1.12, potassium 3.3, troponin 91, total bilirubin 1.6, up from 0.8 the day prior, TSH 7.4.  EKG, personally viewed and interpreted showed sinus at 77 with nonspecific ST-T wave changes ?Imaging study showed the following findings: ?IMPRESSION: ?1. Right upper lobe segmental and subsegmental as well as the right ?lower lobe subsegmental pulmonary emboli. Evaluation of the ?subsegmental level due to timing of contrast. Enlarged right to left ?ventricular ratio (1.1) suggestive of right heart strain. No ?pulmonary infarction. ?2. Common bile and pancreatic duct dilatation concerning for ?underlying proximal pancreatic or ampullary malignancy. Consider MRI ?pancreatic protocol further evaluation. ?3. Right inguinal lymphadenopathy as well as extensive ?retroperitoneal  lymphadenopathy suggestive of lymphoproliferative ?disorder versus metastases. Lymphadenopathy appears to be ?inseparable from the pancreatic body and right kidney. ?Lymphadenopathy encases and abuts abdominal and pelvic vasculature ?as described above, possibly invading the inferior vena cava. The ?left ureter is noted to be in case within the lymphadenopathy with ?delayed excretion of intravenous contrast compared to the right. ?Consider PET-CT. ?4. Peritoneal carcinomatosis. ?5. Cholelithiasis (5 cm calcified stone) with no CT findings of ?acute cholecystitis. ?6. Colonic diverticulosis no findings of acute diverticulitis. ?7. Prostatomegaly. ?8. Partially visualized small to moderate volume left inguinal ?hernia containing a segment of proximal sigmoid colon. No findings ?suggest associated bowel obstruction or ischemia. ?9. Aortic Atherosclerosis (ICD10-I70.0) including four-vessel ?coronary calcification. ? ?Patient treated with an IV fluid bolus.  Hospitalist consulted for admission. ?  ? ?Review of Systems: As mentioned in the history of present illness. All other systems reviewed and are negative. ?Past Medical History:  ?Diagnosis Date  ? Hyperlipidemia   ? Hypertension   ? ?Past Surgical History:  ?Procedure Laterality Date  ? CATARACT EXTRACTION  10/09/2012 and 04/2013  ? ERCP  07/27/2009  ? Impression: Prescence of pancreatic stent. This was found to be patent. -Cholendocholithiasis was found. Complete removal was accomplished by billiary sphincterotomy.- Dilated CBD with single large stone, which was extracted with ballone.   ? Exercise Stress test  05/06/1999  ? PTCA  06/1999  ? STENT  ? TONSILLECTOMY AND ADENOIDECTOMY    ? ?Social History:  reports that he quit smoking about 59 years ago. His smoking use included cigarettes. He has never used smokeless tobacco. He reports current alcohol use of about 2.0 - 3.0 standard drinks per week. He reports that he does not use drugs. ? ?Allergies  ?Allergen  Reactions  ? Lotensin  [  Benazepril Hcl]   ? Penicillins   ? ? ?Family History  ?Problem Relation Age of Onset  ? Hypertension Mother   ? Heart Problems Father   ? ? ?Prior to Admission medications   ?Medication Sig Start Date End Date Taking? Authorizing Provider  ?amLODipine (NORVASC) 2.5 MG tablet TAKE 1 TABLET(2.5 MG) BY MOUTH DAILY 08/02/21  Yes Birdie Sons, MD  ?aspirin 81 MG tablet Take 1 tablet by mouth every other day.    Yes [provider]  ?co-enzyme Q-10 30 MG capsule Take 30 mg by mouth daily.   Yes [provider]  ?cyproheptadine (PERIACTIN) 4 MG tablet Take 1 tablet (4 mg total) by mouth 2 (two) times daily with a meal. 02/08/22  Yes Fisher, Kirstie Peri, MD  ?Emollient (OKEEFFES WORKING HANDS EX) Apply topically daily.   Yes [provider]  ?losartan-hydrochlorothiazide (HYZAAR) 100-25 MG tablet TAKE 1 TABLET BY MOUTH DAILY 09/08/21  Yes Birdie Sons, MD  ?lovastatin (MEVACOR) 20 MG tablet TAKE 1 TABLET(20 MG) BY MOUTH AT BEDTIME 10/31/21  Yes Birdie Sons, MD  ?mirtazapine (REMERON) 7.5 MG tablet Take 1 tablet (7.5 mg total) by mouth at bedtime. 02/08/22  Yes Birdie Sons, MD  ?Multiple Vitamin (MULTIVITAMIN PO) Take 1 tablet by mouth daily.   Yes [provider]  ? ? ?Physical Exam: ?Vitals:  ? 02/15/22 1611 02/15/22 1811 02/15/22 2115 02/15/22 2230  ?BP: 140/63 120/78 126/69 118/64  ?Pulse: 65 65  78  ?Resp: '18 19 17 16  '$ ?Temp: 98.1 ?F (36.7 ?C)     ?TempSrc: Oral     ?SpO2: 95% 100% 99% 94%  ?Weight:      ? ?Physical Exam ?Vitals and nursing note reviewed.  ?Constitutional:   ?   General: He is not in acute distress. ?   Appearance: He is cachectic.  ?HENT:  ?   Head: Normocephalic and atraumatic.  ?Cardiovascular:  ?   Rate and Rhythm: Normal rate and regular rhythm.  ?   Pulses: Normal pulses.  ?   Heart sounds: Normal heart sounds.  ?Pulmonary:  ?   Effort: Pulmonary effort is normal.  ?   Breath sounds: Normal breath sounds.  ?Abdominal:  ?    Palpations: Abdomen is soft.  ?   Tenderness: There is no abdominal tenderness.  ?Neurological:  ?   Mental Status: Mental status is at baseline.  ? ? ? ?Data Reviewed: ?Relevant notes from primary care and specialist visits, past discharge summaries as available in EHR, including Care Everywhere. ?Prior diagnostic testing as pertinent to current admission diagnoses ?Updated medications and problem lists for reconciliation ?ED course, including vitals, labs, imaging, treatment and response to treatment ?Triage notes, nursing and pharmacy notes and ED provider's notes ?Notable results as noted in HPI ? ? ?Assessment and Plan: ?* Acute pulmonary embolism ([place]) ?Subsegmental PE seen right upper lobe and with right heart strain ?Patient is at high risk for PE given suspected malignancy but in the setting of melena and chest subsegmental PE will hold off on systemic anticoagulation after seeing hemoglobin trend and possibly start heparin drip thereafter.  Patient having no overt symptoms of PE such as tachycardia, shortness of breath or hypoxia ? ?Peritoneal carcinomatosis ([place]) ?Oncology consult,. ? ?Melena ?Patient presents with dark stool which strongly positive Hemoccult ?Hemoglobin 12.3 which is up from 11.5 but suspect hemoconcentration ?Protonix IV and hydrate ?Trend hemoglobin and transfuse if necessary ? ?AKI (acute kidney injury) ([place]) ?IV hydration and monitor  and avoid nephrotoxins ? ?Inadequate oral intake ?IV hydration and speech therapy evaluation ?We will keep n.p.o. overnight as patient has been having difficulty swallowing, ? ?Hyperbilirubinemia ?Total bilirubin 1.6, up from 0.8 the day prior ?Common bile and pancreatic duct dilatation concerning for ?underlying proximal pancreatic or ampullary malignancy. Consider MRI ?pancreatic protocol further evaluation. ?GI consult to evaluate for ERCP  ?We will get MRCP as recommended by radiology after discussing goals of care with family ? ?Hypokalemia ?IV  repletion as patient will be n.p.o. ? ?Hypercalcemia ?Secondary to suspected malignancy ?IV hydration ? ?Elevated troponin ?Troponin 91x2 and likely demand ischemia.  During recent hospitalization troponin was in the

## 2022-02-15 NOTE — Assessment & Plan Note (Addendum)
Continue IV hydration.  Monitor serum creatinine. ?Avoid nephrotoxic medications ?

## 2022-02-15 NOTE — ED Provider Notes (Addendum)
? ?[facility] ?Provider Note ? ? ? Event Date/Time  ? First MD Initiated Contact with Patient 02/15/22 1800   ?  (approximate) ? ? ?History  ? ?Rectal Bleeding and Weakness ? ? ?HPI ? ?Benjamin Walls is a 88 y.o. male who comes in complaining of dark stools for several days.  He has not been eating well for couple weeks. ?Patient reports his mouth is too dry to swallow well he is hungry and wants to eat but really cannot get the food down very well.  He appears to get stuck in his throat and then gets stuck in the epigastric area and said like a rock in his stomach for hours.  He is feeling somewhat weak.  He is losing weight. ?  ? ? ?Physical Exam  ? ?Triage Vital Signs: ?ED Triage Vitals  ?Enc Vitals Group  ?   BP 02/15/22 1426 124/61  ?   Pulse Rate 02/15/22 1426 81  ?   Resp 02/15/22 1426 18  ?   Temp 02/15/22 1611 98.1 ?F (36.7 ?C)  ?   Temp Source 02/15/22 1611 Oral  ?   SpO2 02/15/22 1426 100 %  ?   Weight 02/15/22 1425 145 lb 8.1 oz (66 kg)  ?   Height --   ?   Head Circumference --   ?   Peak Flow --   ?   Pain Score 02/15/22 1425 0  ?   Pain Loc --   ?   Pain Edu? --   ?   Excl. in [place]? --   ? ? ?Most recent vital signs: ?Vitals:  ? 02/15/22 2330 02/16/22 0000  ?BP: 137/64 (!) 144/64  ?Pulse: (!) 40 (!) 36  ?Resp: 19 16  ?Temp:    ?SpO2: 95% 95%  ? ? ? ?General: Awake, alert but looks tired ?Mouth: No erythema or exudate ?CV:  Good peripheral perfusion.  Heart regular rate and rhythm no audible murmurs ?Resp:  Normal effort.  Lungs are clear ?Abd:  No distention.  Abdomen is soft no palpable organomegaly mild to minimal diffuse tenderness ?Rectal exam Hemoccult positive stool that is black  ? ? ?ED Results / Procedures / Treatments  ? ?Labs ?(all labs ordered are listed, but only abnormal results are displayed) ?Labs Reviewed  ?BASIC METABOLIC PANEL - Abnormal; Notable for the following components:  ?    Result Value  ? Potassium 3.3 (*)   ? Glucose, Bld 110 (*)   ? BUN 62 (*)   ?  Calcium 12.6 (*)   ? All other components within normal limits  ?CBC - Abnormal; Notable for the following components:  ? RBC 4.07 (*)   ? Hemoglobin 12.3 (*)   ? HCT 37.2 (*)   ? All other components within normal limits  ?TSH - Abnormal; Notable for the following components:  ? TSH 7.421 (*)   ? All other components within normal limits  ?HEPATIC FUNCTION PANEL - Abnormal; Notable for the following components:  ? AST 52 (*)   ? Total Bilirubin 1.6 (*)   ? Bilirubin, Direct 0.3 (*)   ? Indirect Bilirubin 1.3 (*)   ? All other components within normal limits  ?HEMOGLOBIN - Abnormal; Notable for the following components:  ? Hemoglobin 9.6 (*)   ? All other components within normal limits  ?TROPONIN I (HIGH SENSITIVITY) - Abnormal; Notable for the following components:  ? Troponin I (High Sensitivity) 91 (*)   ? All  other components within normal limits  ?TROPONIN I (HIGH SENSITIVITY) - Abnormal; Notable for the following components:  ? Troponin I (High Sensitivity) 91 (*)   ? All other components within normal limits  ?URINALYSIS, ROUTINE W REFLEX MICROSCOPIC  ?HEMOGLOBIN  ?CBG MONITORING, ED  ?SAMPLE TO BLOOD BANK  ?TYPE AND SCREEN  ? ? ? ?EKG ? ?EKG read interpreted by me shows normal sinus rhythm rate of 77 normal axis very irregular baseline nonspecific ST-T wave changes ? ? ?RADIOLOGY ?CT of the neck read by radiology reviewed by me does not show any acute pathology ?CT of the chest and abdomen read by radiology reviewed by me showed multiple pulmonary emboli also carcinomatosis of the abdomen. ? ?PROCEDURES: ? ?Critical Care performed: Critical care time 20 minutes.  This includes evaluating the patient speaking with his wife and son initially then reevaluating the patient and letting him know of the diagnosis later.  Patient's son was gone by that time.  Additionally I spoke with the hospitalist. ? ?Procedures ? ? ?MEDICATIONS ORDERED IN ED: ?Medications  ?acetaminophen (TYLENOL) tablet 650 mg (has no  administration in time range)  ?  Or  ?acetaminophen (TYLENOL) suppository 650 mg (has no administration in time range)  ?ondansetron (ZOFRAN) tablet 4 mg (has no administration in time range)  ?  Or  ?ondansetron (ZOFRAN) injection 4 mg (has no administration in time range)  ?lactated ringers infusion ( Intravenous New Bag/Given 02/16/22 0015)  ?pantoprazole (PROTONIX) injection 40 mg (40 mg Intravenous Given 02/16/22 0015)  ?iohexol (OMNIPAQUE) 300 MG/ML solution 100 mL (100 mLs Intravenous Contrast Given 02/15/22 2127)  ?sodium chloride 0.9 % bolus 1,000 mL (0 mLs Intravenous Stopped 02/16/22 0007)  ? ? ? ?IMPRESSION / MDM / ASSESSMENT AND PLAN / ED COURSE  ?I reviewed the triage vital signs and the nursing notes. ?Patient has been a no code in the hospital before.  Hospitalist will speak with the patient about this again.  Patient will be a difficult case.  He has a GI bleed with black stool which is very intensely Hemoccult positive.  Patient has multiple PEs and he has carcinomatosis of the abdomen with multiple organs partially or completely encased in cancer and multiple nodes present. ? ?The patient is on the cardiac monitor to evaluate for evidence of arrhythmia and/or significant heart rate changes.  None were seen ? ?I should add that the patient's pulse is been recorded both very low and very high.  Every time I go in the room to check this is not the case.  Nurse in fact told me that at 1 point the patient's pulse had been reported as 211 and it really was 60 and had been 60s all time. ? ? ?FINAL CLINICAL IMPRESSION(S) / ED DIAGNOSES  ? 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 ? ? ?Rx / DC Orders  ? ?ED Discharge Orders   ? ? None  ? ?  ? ? ? ?Note:  This document was prepared using Dragon voice recognition software and may include unintentional dictation errors. ?  ?Nena Polio, MD ?02/16/22 [phone] ? ?   ?Nena Polio, MD ?02/16/22 [phone] ? ?

## 2022-02-15 NOTE — ED Triage Notes (Addendum)
Pt comes pov with dark stools for an unknown amount of time. Hasn't been eating well for weeks. Is getting weak from this. Has had multiple falls. Not on thinners. Family states that he has been eating/drinking less and less, getting weaker and weaker.  ?

## 2022-02-15 NOTE — Assessment & Plan Note (Addendum)
Replaced.  Continue to monitor °

## 2022-02-15 NOTE — Assessment & Plan Note (Signed)
Dietary consult for further specification of degree of protein calorie malnutrition ?

## 2022-02-15 NOTE — ED Notes (Signed)
Called lab to verify labs were down there. Orders placed.  ?

## 2022-02-15 NOTE — Assessment & Plan Note (Addendum)
Total bilirubin was 1.6 up from 0.8 from 1 day prior. ?Common bile and pancreatic duct dilatation concerning for underlying proximal pancreatic or ampullary malignancy. Consider MRI pancreatic protocol further evaluation. ?GI consulted to evaluate for ERCP. ?Patient refused to have MRCP and ERCP. ?Palliative care consulted to discuss goals of care. ?

## 2022-02-15 NOTE — Assessment & Plan Note (Signed)
Dietary and TOC consults ?

## 2022-02-15 NOTE — Assessment & Plan Note (Addendum)
Troponin 9 1x 2 and likely demand ischemia.   ?During recent hospitalization troponin was in the 2000's, believed secondary to rhabdo. ?Patient denies any chest pain. ?

## 2022-02-15 NOTE — Assessment & Plan Note (Addendum)
Oncology consulted.  Palliative care might be appropriate in view of patient's advanced age ?

## 2022-02-15 NOTE — Assessment & Plan Note (Addendum)
Patient presented with dark stool, Hemoccult+ ?Hemoglobin remains stable, 12.3> 11.5. ?Continue IV Protonix and IV hydration. ?Continue to monitor H&H and transfuse as needed. ?GI is consulted, patient is refusing all interventions. ? ? ?

## 2022-02-15 NOTE — Assessment & Plan Note (Addendum)
Continue IV hydration, speech evaluation ?Continue n.p.o. as patient may require speech evaluation/ intervention. ?

## 2022-02-15 NOTE — ED Notes (Signed)
Pt placed in ED stretcher for comfort (was in recliner). ?Pt provided urinal. ?Son and wife at bedside. ?

## 2022-02-16 ENCOUNTER — Encounter: Payer: Self-pay | Admitting: Internal Medicine

## 2022-02-16 ENCOUNTER — Inpatient Hospital Stay: Payer: Medicare Other

## 2022-02-16 DIAGNOSIS — K8689 Other specified diseases of pancreas: Secondary | ICD-10-CM | POA: Diagnosis not present

## 2022-02-16 DIAGNOSIS — C786 Secondary malignant neoplasm of retroperitoneum and peritoneum: Secondary | ICD-10-CM | POA: Diagnosis not present

## 2022-02-16 DIAGNOSIS — I2699 Other pulmonary embolism without acute cor pulmonale: Secondary | ICD-10-CM | POA: Diagnosis not present

## 2022-02-16 LAB — URINALYSIS, ROUTINE W REFLEX MICROSCOPIC
Bilirubin Urine: NEGATIVE
Glucose, UA: NEGATIVE mg/dL
Ketones, ur: 20 mg/dL — AB
Leukocytes,Ua: NEGATIVE
Nitrite: NEGATIVE
Protein, ur: NEGATIVE mg/dL
Specific Gravity, Urine: 1.045 — ABNORMAL HIGH (ref 1.005–1.030)
pH: 5 (ref 5.0–8.0)

## 2022-02-16 LAB — HEMOGLOBIN
Hemoglobin: 9.6 g/dL — ABNORMAL LOW (ref 13.0–17.0)
Hemoglobin: 9.6 g/dL — ABNORMAL LOW (ref 13.0–17.0)

## 2022-02-16 LAB — PROTIME-INR
INR: 1.2 (ref 0.8–1.2)
Prothrombin Time: 14.9 seconds (ref 11.4–15.2)

## 2022-02-16 MED ORDER — PANTOPRAZOLE SODIUM 40 MG IV SOLR
40.0000 mg | Freq: Two times a day (BID) | INTRAVENOUS | Status: DC
  Administered 2022-02-16 – 2022-02-17 (×3): 40 mg via INTRAVENOUS
  Filled 2022-02-16 (×3): qty 10

## 2022-02-16 MED ORDER — ACETAMINOPHEN 325 MG PO TABS: 650.0000 mg | ORAL_TABLET | Freq: Four times a day (QID) | ORAL | Status: DC | PRN

## 2022-02-16 MED ORDER — ACETAMINOPHEN 650 MG RE SUPP: 650.0000 mg | Freq: Four times a day (QID) | RECTAL | Status: DC | PRN

## 2022-02-16 MED ORDER — PANTOPRAZOLE SODIUM 40 MG IV SOLR
40.0000 mg | INTRAVENOUS | Status: DC
  Administered 2022-02-16: 40 mg via INTRAVENOUS
  Filled 2022-02-16: qty 10

## 2022-02-16 MED ORDER — ONDANSETRON HCL 4 MG/2ML IJ SOLN
4.0000 mg | Freq: Four times a day (QID) | INTRAMUSCULAR | Status: DC | PRN
  Administered 2022-02-17: 4 mg via INTRAVENOUS
  Filled 2022-02-16: qty 2

## 2022-02-16 MED ORDER — LACTATED RINGERS IV SOLN: INTRAVENOUS | Status: DC

## 2022-02-16 MED ORDER — ONDANSETRON HCL 4 MG PO TABS: 4.0000 mg | ORAL_TABLET | Freq: Four times a day (QID) | ORAL | Status: DC | PRN

## 2022-02-16 NOTE — Progress Notes (Signed)
?Progress Note ? ? ?Patient: Benjamin Walls QXI:[id number] DOB: 08-Dec-1929 DOA: 02/15/2022     1 ?DOS: the patient was seen and examined on 02/16/2022 ?  ?Brief hospital course: ?This is 88 years old male with PMH significant for CAD, hypertension, hyperlipidemia, choledocholithiasis with prior sphincterectomy, pancreatic stent in 2010 who was hospitalized from 02/04/2022 to 02/06/2022 with a fall with work-up concerning for hypercalcemia rhabdomyolysis, troponinemia in the 2000's secondary to dehydration and demand ischemia treated with hydration and discharged home with home health presented back in the ED due to dark stools,  progressive weakness and continued decreased p.o. intake for liquids and difficulty swallowing medications.  Work-up in the ED showed slightly elevated bilirubin.  Imaging shows right upper lobe segmental and subsegmental PE with right heart strain.  Common bile and pancreatic duct dilatation concerning for pancreatic or ampullary malignancy,  consider MRI/ERCP.  Right inguinal lymphadenopathy as well as extensive retroperitoneal lymphadenopathy suggestive of lymphoproliferative disorder versus metastasis  Peritoneal carcinomatosis.  Oncology, gastroenterology consulted.  Oncologist recommended right inguinal lymph node biopsy.  Patient has been refusing all the interventions and procedures.  Palliative care consulted to discuss goals of care. ? ?Assessment and Plan: ?* Acute pulmonary embolism ([place]) ?CTA chest shows subsegmental PE in right upper lobe and with right heart strain. ?Patient was at high risk for PE given suspected malignancy but in the setting of melena, we will hold off on systemic anticoagulation at this time.  We will continue to monitor H&H and consider heparin if H&H remained stable. ?Patient having no overt symptoms of PE such as tachycardia, shortness of breath or hypoxia. ? ?Peritoneal carcinomatosis ([place]) ?Oncology is consulted.   ?Recommended right inguinal lymph node  biopsy by IR. ?Patient refusing all procedures. ? ?Melena ?Patient presented with dark stool, Hemoccult+ ?Hemoglobin remains stable, 12.3> 11.5. ?Continue IV Protonix and IV hydration. ?Continue to monitor H&H and transfuse as needed. ?GI is consulted, patient is refusing all interventions. ? ? ? ?AKI (acute kidney injury) ([place]) ?Continue IV hydration.  Monitor serum creatinine. ?Avoid nephrotoxic medications ? ?Inadequate oral intake ?Continue IV hydration, speech evaluation ?Continue n.p.o. as patient may require speech evaluation/ intervention. ? ?Hyperbilirubinemia ?Total bilirubin was 1.6 up from 0.8 from 1 day prior. ?Common bile and pancreatic duct dilatation concerning for underlying proximal pancreatic or ampullary malignancy. Consider MRI pancreatic protocol further evaluation. ?GI consulted to evaluate for ERCP. ?Patient refused to have MRCP and ERCP. ?Palliative care consulted to discuss goals of care. ? ?Hypokalemia ?Replaced.  Continue to monitor ? ?Hypercalcemia ?Secondary to suspected malignancy ?IV hydration ? ?Elevated troponin ?Troponin 9 1x 2 and likely demand ischemia.   ?During recent hospitalization troponin was in the 2000's, believed secondary to rhabdo. ?Patient denies any chest pain. ? ?Protein calorie malnutrition ([place]) ?Dietary consult for further specification of degree of protein calorie malnutrition ? ?Generalized weakness ?Dietary and TOC consults ? ?Retroperitoneal lymphadenopathy, suspicious for malignancy ?Oncology consulted.  Palliative care might be appropriate in view of patient's advanced age ? ?Choledocholithiasis s/p splint sphincterotomy and pancreatic stent 2010 ?History of ERCP in 2010 with sphincterotomy and pancreatic stent placement.  No prior cholecystectomy. ? ? ?Subjective: Patient was seen and examined at bedside.  Overnight events noted. ?Patient has been refusing all intervention.  Clear liquid diet restarted. ? ?Physical Exam: ?Vitals:  ? 02/16/22 0600 02/16/22  0800 02/16/22 1100 02/16/22 1337  ?BP: 127/69 122/75 124/80 126/85  ?Pulse:  80 79 79  ?Resp: 15 16 (!) 24 20  ?Temp:  98.2 ?  F (36.8 ?C) 98.7 ?F (37.1 ?C) 98.7 ?F (37.1 ?C)  ?TempSrc:  Oral Oral Oral  ?SpO2: 98% 98% 96% 96%  ?Weight:      ? ?General exam: Appears comfortable, not in any acute distress.  Deconditioned ?Respiratory system: CTA bilaterally, no wheezing, no crackles, normal respiratory effort. ?Cardiovascular system: S1-S2 heard, regular rate and rhythm, no murmur. ?Gastrointestinal system: Abdomen is soft, nontender, nondistended, BS+. ?Central nervous system: Alert, oriented x3, no focal neurological deficits. ?Extremities: No edema, no cyanosis, no clubbing. ?Psychiatry: Mood, insight, judgment normal. ? ? ?Data Reviewed: ?I have Reviewed nursing notes, Vitals, and Lab results since pt's last encounter. Pertinent lab results CBC, CMP, mag, Phos ?I have ordered test including CBC, BMP, mag, Phos, CA 125, CEA ?I have reviewed the last note from gastroenterologist,  ?I have discussed pt's care plan and test results with patient.  ? ?Family Communication: No family at bedside ? ?Disposition: ?Status is: Inpatient ?Remains inpatient appropriate because:  ?Admitted for multiple complaints, small subsegmental PE, melena,, peritoneal carcinomatosis, metastasis.  Oncology, gastroenterology consulted.  Patient has been refusing intervention.  Palliative care consulted to discuss goals of care. ? ? ? Planned Discharge Destination: Home ? ? ? ?Time spent: 50 minutes ? ?Author: ?Shawna Clamp, MD ?02/16/2022 1:44 PM ? ?For on call review [email or web address].  ?

## 2022-02-16 NOTE — Progress Notes (Signed)
Order was placed for retroperitoneal lymph node biopsy. Dr. Dwaine Gale reviewed and approved inguinal lymph node biopsy after the pt was stabilized for GI bleed, PE and GOC were established. ? Patient now refusing procedures/interventions per admitting note. Team was notified that biopsy order will be cancelled and if there is a change, they may reorder for inguinal lymph node biopsy.  ? ? ?Narda Rutherford, AGNP-BC ?02/16/2022, 4:06 PM ? ? ?

## 2022-02-16 NOTE — Hospital Course (Signed)
This is 88 years old male with PMH significant for CAD, hypertension, hyperlipidemia, choledocholithiasis with prior sphincterectomy, pancreatic stent in 2010 who was hospitalized from 02/04/2022 to 02/06/2022 with a fall with work-up concerning for hypercalcemia rhabdomyolysis, troponinemia in the 2000's secondary to dehydration and demand ischemia treated with hydration and discharged home with home health presented back in the ED due to dark stools,  progressive weakness and continued decreased p.o. intake for liquids and difficulty swallowing medications.  Work-up in the ED showed slightly elevated bilirubin.  Imaging shows right upper lobe segmental and subsegmental PE with right heart strain.  Common bile and pancreatic duct dilatation concerning for pancreatic or ampullary malignancy,  consider MRI/ERCP.  Right inguinal lymphadenopathy as well as extensive retroperitoneal lymphadenopathy suggestive of lymphoproliferative disorder versus metastasis  Peritoneal carcinomatosis.  Oncology, gastroenterology consulted.  Oncologist recommended right inguinal lymph node biopsy.  Patient has been refusing all the interventions and procedures.  Palliative care consulted to discuss goals of care. ?

## 2022-02-16 NOTE — ED Notes (Signed)
Patient advised MRI tech that he did not want to have an MRI during this time of night. ?

## 2022-02-16 NOTE — Consult Note (Signed)
?Hematology/Oncology Consult note ?[place] [facility] ([place]) ?Telephone:([phone] Fax:([phone]) [phone] ?  ? ?Patient Care Team: ?Birdie Sons, MD as PCP - General (Family Medicine)  ? ?Name of the patient: Benjamin Walls  ?[id number]  ?1930-03-01  ? ?Date of visit: 02/16/2022 ? ?History of Presenting Illness-Yoshimi Vilar is an 88 year old male with past medical history significant for coronary artery disease, hypertension, hyperlipidemia, cholelithiasis with prior sphincterectomy, pancreatic stent did not 2010 who presented to the ER from home on 02/15/2022.  He says over the past month he has lost his sense of taste, developed anorexia, increased progressive weakness, dark stools. Complains of feeling like food gets stuck in his stomach and throat. Dry mouth is quite bothersome. Before the last month he was active, walked a mile a day or more, drove, socialized with friends as neighborhood [place], helped out neighbors. He denies nausea, vomiting, abdominal pain, constipation, diarrhea, fevers, chills, cough.  ?In ER, he was found to have hypercalcemia, 12.6, elevated BUN 62, normal creatinine though elevated from his baseline, slight hypokalemia 3.3, AST elevated at 52, bilirubin elevated at 1.6 with direct 0.3 (elevated), indirect 1.3 (elevated), hemoglobin 12.3 which is improved from previous of 11.5.  No leukocytosis. Troponin was elevated at 91.  ? ?CT soft tissue neck with contrast performed given difficulty swallowing.  Did not show any mass, lesions, or adenopathy within the neck.  Few indeterminate pulmonary nodules are seen measuring up to 8 mm.  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  Dominant left vertebral artery widely patent.  Moderate cervical spondylosis. ? ?CT chest abdomen pelvis with contrast showed right upper lobe segmental and subsegmental as well as right lower lobe subsegmental pulmonary emboli.  Right heart strain. No pulmonary infarction. Common bile duct and pancreatic duct dilatation concerning for underlying proximal pancreatic or ampullary malignancy. Right inguinal lymphadenopathy as well as extensive retroperitoneal lymphadenopathy suggestive of lymphoproliferative disorder versus metastases.  Lymphadenopathy appears to be inseparable from the pancreatic body and right kidney.  Lymphadenopathy encases and abuts abdominal and pelvic vasculature possibly invading the inferior vena cava.  Left ureter is encased within the lymphadenopathy.  Peritoneal carcinomatosis. Other incidental findings.  ? ?Patient met with GI who recommended MCRCP and IR for possible biopsy. Patient declined invasive procedures.  ? ?Given findings on on imaging, oncology was consulted.  ? ?ECOG PS- 2-3 ? ?Review of Systems  ?Constitutional:  Positive for malaise/fatigue and weight loss. Negative for chills and fever.  ?HENT:  Negative for congestion, hearing loss, nosebleeds, sore throat and tinnitus.   ?     Per hpi- early satiety, globus sensation, taste changes, dry mouth  ?Eyes:  Negative for blurred vision and double vision.  ?Respiratory:  Negative for cough, hemoptysis, shortness of breath and wheezing.   ?Cardiovascular:  Negative for chest pain, palpitations and leg swelling.  ?Gastrointestinal:  Positive for melena. Negative for abdominal pain, blood in stool, constipation, diarrhea, nausea and vomiting.  ?Genitourinary:  Negative for dysuria and urgency.  ?Musculoskeletal:  Positive for falls. Negative for back pain, joint pain and myalgias.  ?Skin:  Negative for itching and rash.  ?Neurological:  Positive for weakness. Negative for dizziness, tingling, sensory change, loss of consciousness and headaches.  ?Endo/Heme/Allergies:  Negative for environmental allergies. Does not bruise/bleed easily.  ?Psychiatric/Behavioral:  Negative for depression. The patient is not nervous/anxious and does not have insomnia.    ? ?Allergies  ?Allergen Reactions  ? Lotensin  [Benazepril Hcl]   ?  Penicillins   ? ? ?Patient Active Problem List  ? Diagnosis Date Noted  ? Acute pulmonary embolism ([place]) 02/15/2022  ? Melena 02/15/2022  ? Retroperitoneal lymphadenopathy, suspicious for malignancy 02/15/2022  ? Hyperbilirubinemia 02/15/2022  ? Generalized weakness 02/15/2022  ? Inadequate oral intake 02/15/2022  ? AKI (acute kidney injury) ([place]) 02/15/2022  ? Protein calorie malnutrition ([place]) 02/15/2022  ? Peritoneal carcinomatosis ([place]) 02/15/2022  ? Hypercalcemia 02/04/2022  ? Rhabdomyolysis 02/04/2022  ? Elevated troponin 02/04/2022  ? Hypokalemia 02/04/2022  ? Dehydration 02/04/2022  ? Fall 02/04/2022  ? Heart murmur, systolic 21/30/8657  ? Encounter for immunization 10/10/2020  ? Senile purpura ([place]) 07/31/2018  ? Choledocholithiasis s/p splint sphincterotomy and pancreatic stent 2010 09/13/2015  ? HLD (hyperlipidemia) 09/13/2015  ? Hypertension 09/13/2015  ? BPH (benign prostatic hypertrophy) 09/13/2015  ? Hernia, inguinal, left 09/05/2011  ? ? ?Past Medical History:  ?Diagnosis Date  ? Hyperlipidemia   ? Hypertension   ? ? ?Past Surgical History:  ?Procedure Laterality Date  ? CATARACT EXTRACTION  10/09/2012 and 04/2013  ? ERCP  07/27/2009  ? Impression: Prescence of pancreatic stent. This was found to be patent. -Cholendocholithiasis was found. Complete removal was accomplished by billiary sphincterotomy.- Dilated CBD with single large stone, which was extracted with ballone.   ? Exercise Stress test  05/06/1999  ? PTCA  06/1999  ? STENT  ? TONSILLECTOMY AND ADENOIDECTOMY    ? ? ?Social History  ? ?Socioeconomic History  ? Marital status: Married  ?  Spouse name: Not on file  ? Number of children: 2  ? Years of education: Not on file  ? Highest education level: Bachelor's degree (e.g., BA, AB, BS)  ?Occupational History  ? Occupation: Tour manager in schools in [place]  ?  Comment: Works at [employer]  ?  Comment: not  cuurently working due to Covid19  ?Tobacco Use  ? Smoking status: Former  ?  Types: Cigarettes  ?  Quit date: 11/06/1962  ?  Years since quitting: 59.3  ? Smokeless tobacco: Never  ?Vaping Use  ? Vaping Use: Never used  ?Substance and Sexual Activity  ? Alcohol use: Yes  ?  Alcohol/week: 2.0 - 3.0 standard drinks  ?  Types: 2 - 3 Glasses of wine per week  ? Drug use: No  ? Sexual activity: Not on file  ?Other Topics Concern  ? Not on file  ?Social History Narrative  ? Not on file  ? ?Social Determinants of Health  ? 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 He is active, loves his family and loves people. His wife jokes that he has attention to detail. 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 He enjoys socializing with his neighbors and drives 5 blocks every morning to check on his neighbors. [place] and gets together with friends regularly. Up until a month ago he was walking a mile daily.   ? ?Family History  ?Problem Relation Age of Onset  ? Hypertension Mother   ? Heart Problems Father   ? ? ?Current  Facility-Administered Medications:  ?  acetaminophen (TYLENOL) tablet 650 mg, 650 mg, Oral, Q6H PRN **OR** acetaminophen (TYLENOL) suppository 650 mg, 650 mg, Rectal, Q6H PRN, Athena Masse, MD ?  lactated ringers infusion, , Intravenous, Continuous, Athena Masse, MD, Last Rate: 125 mL/hr at 02/16/22 1536, New Bag at 02/16/22 1536 ?  ondansetron (ZOFRAN) tablet 4 mg, 4 mg, Oral, Q6H PRN **OR** ondansetron (ZOFRAN) injection 4 mg, 4 mg, Intravenous, Q6H PRN, Athena Masse, MD ?  pantoprazole  (PROTONIX) injection 40 mg, 40 mg, Intravenous, Q12H, Ok Edwards, NP, 40 mg at 02/16/22 1212 ? ?BP 138/76 (BP Location: Right Arm)   Pulse 82   Temp 98.1 ?F (36.7 ?C) (Oral)   Resp 18   Wt 145 lb 8.1

## 2022-02-16 NOTE — Consult Note (Signed)
Consultation ? ?Referring Provider:     Dr Damita Dunnings ?Admit date 02/15/22 ?Consult date     02/16/22    ?Reason for Consultation:     melena ?       ? HPI:   ?Benjamin Walls is a 88 y.o. male  with medical history significant for CAD, HTN, HLD, choledocholithiasis with prior sphincterotomy pancreatic stent in 2010. Recently admitted 10-12d ago with fall/rhabdomyolylsis/dehydration. Currently admitted with dark stools, weakness, poor oral intake. Founf to have PE, peritoneal carcinomatosis, lymphadenopathy, CBC/pancreatic duct dilation concerning for underlying pancreatic or ampullary cancer. Gi has been consulted for melena- note his hgb is stable at 9.6- has been decreasing over the year. ALT 51 otherwise liver enzymes normal. Lipase and amylase normal. Platelet count normal on admission ?He has not been on PPI at home. ?He is on pantoprazole '40mg'$  iv q24h currently. MRCP and IR consult for possible lymph  biopsy has been ordered for today. ?Patient reports he has been having a black formed stool qod for some days. Appetite has been decreased, he has been tired, there is early satiety. Denies any abdominal pain, NVD, dyspepsia/gerd, further GI concerns. He denies any family history of colorectal cancer/colon polyps/pancreatic disease/liver disease. States he has family here and they are close. He states he does not want to have any sedated procedures, including the MRCP. He states he would like something to drink. ? ?PREVIOUS ENDOSCOPIES:            ?ERCP 2010 ? ? ?Past Medical History:  ?Diagnosis Date  ? Hyperlipidemia   ? Hypertension   ? ? ?Past Surgical History:  ?Procedure Laterality Date  ? CATARACT EXTRACTION  10/09/2012 and 04/2013  ? ERCP  07/27/2009  ? Impression: Prescence of pancreatic stent. This was found to be patent. -Cholendocholithiasis was found. Complete removal was accomplished by billiary sphincterotomy.- Dilated CBD with single large stone, which was extracted with ballone.   ? Exercise Stress  test  05/06/1999  ? PTCA  06/1999  ? STENT  ? TONSILLECTOMY AND ADENOIDECTOMY    ? ? ?Family History  ?Problem Relation Age of Onset  ? Hypertension Mother   ? Heart Problems Father   ?  ? ?Social History  ? ?Tobacco Use  ? Smoking status: Former  ?  Types: Cigarettes  ?  Quit date: 11/06/1962  ?  Years since quitting: 59.3  ? Smokeless tobacco: Never  ?Vaping Use  ? Vaping Use: Never used  ?Substance Use Topics  ? Alcohol use: Yes  ?  Alcohol/week: 2.0 - 3.0 standard drinks  ?  Types: 2 - 3 Glasses of wine per week  ? Drug use: No  ? ? ?Prior to Admission medications   ?Medication Sig Start Date End Date Taking? Authorizing Provider  ?amLODipine (NORVASC) 2.5 MG tablet TAKE 1 TABLET(2.5 MG) BY MOUTH DAILY 08/02/21  Yes Birdie Sons, MD  ?aspirin 81 MG tablet Take 1 tablet by mouth every other day.    Yes [provider]  ?co-enzyme Q-10 30 MG capsule Take 30 mg by mouth daily.   Yes [provider]  ?cyproheptadine (PERIACTIN) 4 MG tablet Take 1 tablet (4 mg total) by mouth 2 (two) times daily with a meal. 02/08/22  Yes Fisher, Kirstie Peri, MD  ?Emollient (OKEEFFES WORKING HANDS EX) Apply topically daily.   Yes [provider]  ?losartan-hydrochlorothiazide (HYZAAR) 100-25 MG tablet TAKE 1 TABLET BY MOUTH DAILY 09/08/21  Yes Birdie Sons, MD  ?lovastatin (MEVACOR) 20 MG  tablet TAKE 1 TABLET(20 MG) BY MOUTH AT BEDTIME 10/31/21  Yes Birdie Sons, MD  ?mirtazapine (REMERON) 7.5 MG tablet Take 1 tablet (7.5 mg total) by mouth at bedtime. 02/08/22  Yes Birdie Sons, MD  ?Multiple Vitamin (MULTIVITAMIN PO) Take 1 tablet by mouth daily.   Yes [provider]  ? ? ?Current Facility-Administered Medications  ?Medication Dose Route Frequency Provider Last Rate Last Admin  ? acetaminophen (TYLENOL) tablet 650 mg  650 mg Oral Q6H PRN Athena Masse, MD      ? Or  ? acetaminophen (TYLENOL) suppository 650 mg  650 mg Rectal Q6H PRN Athena Masse, MD      ? lactated ringers infusion    Intravenous Continuous Athena Masse, MD 125 mL/hr at 02/16/22 0015 New Bag at 02/16/22 0015  ? ondansetron (ZOFRAN) tablet 4 mg  4 mg Oral Q6H PRN Athena Masse, MD      ? Or  ? ondansetron [facility]) injection 4 mg  4 mg Intravenous Q6H PRN Athena Masse, MD      ? pantoprazole (PROTONIX) injection 40 mg  40 mg Intravenous Q24H Athena Masse, MD   40 mg at 02/16/22 0015  ? ?Current Outpatient Medications  ?Medication Sig Dispense Refill  ? amLODipine (NORVASC) 2.5 MG tablet TAKE 1 TABLET(2.5 MG) BY MOUTH DAILY 90 tablet 4  ? aspirin 81 MG tablet Take 1 tablet by mouth every other day.     ? co-enzyme Q-10 30 MG capsule Take 30 mg by mouth daily.    ? cyproheptadine (PERIACTIN) 4 MG tablet Take 1 tablet (4 mg total) by mouth 2 (two) times daily with a meal. 30 tablet 0  ? Emollient (OKEEFFES WORKING HANDS EX) Apply topically daily.    ? losartan-hydrochlorothiazide (HYZAAR) 100-25 MG tablet TAKE 1 TABLET BY MOUTH DAILY 90 tablet 4  ? lovastatin (MEVACOR) 20 MG tablet TAKE 1 TABLET(20 MG) BY MOUTH AT BEDTIME 90 tablet 4  ? mirtazapine (REMERON) 7.5 MG tablet Take 1 tablet (7.5 mg total) by mouth at bedtime. 30 tablet 1  ? Multiple Vitamin (MULTIVITAMIN PO) Take 1 tablet by mouth daily.    ? ? ?Allergies as of 02/15/2022 - Review Complete 02/15/2022  ?Allergen Reaction Noted  ? Lotensin  [benazepril hcl]  09/13/2015  ? Penicillins  09/13/2015  ? ? ? ?Review of Systems:    ?All systems reviewed and negative except where noted in HPI. ? ? ? ? ? Physical Exam:  ?Vital signs in last 24 hours: ?Temp:  [98.1 ?F (36.7 ?C)] 98.1 ?F (36.7 ?C) (04/13 0400) ?Pulse Rate:  [36-81] 78 (04/13 0400) ?Resp:  [15-20] 15 (04/13 0600) ?BP: (115-144)/(60-78) 127/69 (04/13 0600) ?SpO2:  [94 %-100 %] 98 % (04/13 0600) ?Weight:  [66 kg] 66 kg (04/12 1425) ?  ?General:   Pleasant elderly man in NAD ?Head:  Normocephalic and atraumatic. ?Eyes:   No icterus.   Conjunctiva pink. ?Ears:  Normal auditory acuity. ?Mouth: Mucosa pink moist,  no lesions. ?Neck:  Supple; no masses felt ?Lungs:  Respirations even and unlabored. Lungs clear to auscultation bilaterally.   No wheezes, crackles, or rhonchi.  ?Heart:  S1S2, RRR, no MRG. No edema. ?Abdomen:   Flat, soft, nondistended, nontender. Normal bowel sounds. No appreciable masses or hepatomegaly. No rebound signs or other peritoneal signs. ?Rectal:  Not performed.  ?Msk:  MAEW x4, No clubbing or cyanosis. Strength 5/5. Symmetrical without gross deformities. ?Neurologic:  Alert and  oriented x4;  Cranial nerves II-XII intact.  ?Skin:  Warm, dry, pink without significant lesions or rashes. ?Psych:  Alert and cooperative. Normal affect. ? ?LAB RESULTS: ?Recent Labs  ?  02/15/22 ?1427 02/16/22 ?0021 02/16/22 ?7989  ?WBC 7.1  --   --   ?HGB 12.3* 9.6* 9.6*  ?HCT 37.2*  --   --   ?PLT 177  --   --   ? ?BMET ?Recent Labs  ?  02/14/22 ?1124 02/15/22 ?1427  ?NA 143 144  ?K 3.3* 3.3*  ?CL 99 102  ?CO2 29 28  ?GLUCOSE 106* 110*  ?BUN 54* 62*  ?CREATININE 1.24 1.12  ?CALCIUM 12.3* 12.6*  ? ?LFT ?Recent Labs  ?  02/15/22 ?1835  ?PROT 7.5  ?ALBUMIN 3.7  ?AST 52*  ?ALT 35  ?ALKPHOS 93  ?BILITOT 1.6*  ?BILIDIR 0.3*  ?IBILI 1.3*  ? ?PT/INR ?No results for input(s): LABPROT, INR in the last 72 hours. ? ?STUDIES: ?CT Soft Tissue Neck W Contrast ? ?Result Date: 02/15/2022 ?CLINICAL DATA:  Initial evaluation for occult malignancy. EXAM: CT NECK WITH CONTRAST TECHNIQUE: Multidetector CT imaging of the neck was performed using the standard protocol following the bolus administration of intravenous contrast. RADIATION DOSE REDUCTION: This exam was performed according to the departmental dose-optimization program which includes automated exposure control, adjustment of the mA and/or kV according to patient size and/or use of iterative reconstruction technique. CONTRAST:  155m OMNIPAQUE IOHEXOL 300 MG/ML  SOLN COMPARISON:  None. FINDINGS: Pharynx and larynx: Oral cavity within normal limits without discrete mass or collection. No  acute abnormality about the visualized dentition. No visible base of tongue mass. Oropharynx and nasopharynx within normal limits. No retropharyngeal collection or swelling. Epiglottis within normal lim

## 2022-02-16 NOTE — ED Notes (Signed)
Did not want to get out of bed for orthostatics at this late hour per patient. ?

## 2022-02-16 NOTE — Progress Notes (Addendum)
Manufacturing engineer [facility]) Hospital Liaison Note ?  ?Received request from PMT provider/Joshua B.for family interest in patient retuning home with hospice services.Per Vonna Kotyk, spouse/Margaret/[phone] prefers to be present for hospice conversation. MSW contacted Joycelyn Schmid to confirm presence in [facility] and she reports that she had a family emergency and would not return until tomorrow AM. Plan is for [facility] to f/u with family tomorrow AM to explain services and begin evaluation process.  ? ?Please do not hesitate to call with any hospice related questions.  ?  ?Thank you for the opportunity to participate in this patient's care. ?  ?Daphene Calamity, MSW ?[place]  ?[phone] ?

## 2022-02-17 ENCOUNTER — Telehealth: Payer: Self-pay

## 2022-02-17 ENCOUNTER — Ambulatory Visit: Admission: RE | Admit: 2022-02-17 | Payer: Medicare Other | Source: Ambulatory Visit

## 2022-02-17 LAB — COMPREHENSIVE METABOLIC PANEL
ALT: 32 IU/L (ref 0–44)
ALT: 23 U/L (ref 0–44)
AST: 51 IU/L — ABNORMAL HIGH (ref 0–40)
AST: 37 U/L (ref 15–41)
Albumin/Globulin Ratio: 1.8 (ref 1.2–2.2)
Albumin: 3.7 g/dL (ref 3.6–4.6)
Albumin: 2.8 g/dL — ABNORMAL LOW (ref 3.5–5.0)
Alkaline Phosphatase: 103 IU/L (ref 44–121)
Alkaline Phosphatase: 77 U/L (ref 38–126)
Anion gap: 9 (ref 5–15)
BUN/Creatinine Ratio: 44 — ABNORMAL HIGH (ref 10–24)
BUN: 54 mg/dL — ABNORMAL HIGH (ref 8–27)
BUN: 48 mg/dL — ABNORMAL HIGH (ref 8–23)
Bilirubin Total: 0.8 mg/dL (ref 0.0–1.2)
CO2: 29 mmol/L (ref 20–29)
CO2: 29 mmol/L (ref 22–32)
Calcium: 12.3 mg/dL — ABNORMAL HIGH (ref 8.6–10.2)
Calcium: 11.1 mg/dL — ABNORMAL HIGH (ref 8.9–10.3)
Chloride: 99 mmol/L (ref 96–106)
Chloride: 111 mmol/L (ref 98–111)
Creatinine, Ser: 1.24 mg/dL (ref 0.76–1.27)
Creatinine, Ser: 0.94 mg/dL (ref 0.61–1.24)
GFR, Estimated: >60 mL/min (ref >60)
Globulin, Total: 2.1 g/dL (ref 1.5–4.5)
Glucose, Bld: 90 mg/dL (ref 70–99)
Glucose: 106 mg/dL — ABNORMAL HIGH (ref 70–99)
Potassium: 3.3 mmol/L — ABNORMAL LOW (ref 3.5–5.2)
Potassium: 2.9 mmol/L — ABNORMAL LOW (ref 3.5–5.1)
Sodium: 143 mmol/L (ref 134–144)
Sodium: 149 mmol/L — ABNORMAL HIGH (ref 135–145)
Total Bilirubin: 1 mg/dL (ref 0.3–1.2)
Total Protein: 5.8 g/dL — ABNORMAL LOW (ref 6.0–8.5)
Total Protein: 5.5 g/dL — ABNORMAL LOW (ref 6.5–8.1)
eGFR: 56 mL/min/{1.73_m2} — ABNORMAL LOW (ref >59)

## 2022-02-17 LAB — PHOSPHORUS: Phosphorus: 2.4 mg/dL — ABNORMAL LOW (ref 2.5–4.6)

## 2022-02-17 LAB — PROTEIN ELECTROPHORESIS, SERUM
A/G Ratio: 1.5 (ref 0.7–1.7)
Albumin ELP: 3.5 g/dL (ref 2.9–4.4)
Alpha 1: 0.2 g/dL (ref 0.0–0.4)
Alpha 2: 0.5 g/dL (ref 0.4–1.0)
Beta: 0.8 g/dL (ref 0.7–1.3)
Gamma Globulin: 0.8 g/dL (ref 0.4–1.8)
Globulin, Total: 2.3 g/dL (ref 2.2–3.9)

## 2022-02-17 LAB — LIPASE: Lipase: 15 U/L (ref 13–78)

## 2022-02-17 LAB — CBC
HCT: 30.7 % — ABNORMAL LOW (ref 39.0–52.0)
Hemoglobin: 10.1 g/dL — ABNORMAL LOW (ref 13.0–17.0)
MCH: 30 pg (ref 26.0–34.0)
MCHC: 32.9 g/dL (ref 30.0–36.0)
MCV: 91.1 fL (ref 80.0–100.0)
Platelets: 123 10*3/uL — ABNORMAL LOW (ref 150–400)
RBC: 3.37 MIL/uL — ABNORMAL LOW (ref 4.22–5.81)
RDW: 13.9 % (ref 11.5–15.5)
WBC: 8 10*3/uL (ref 4.0–10.5)
nRBC: 0 % (ref 0.0–0.2)

## 2022-02-17 LAB — VITAMIN A: Vitamin A: 29.7 ug/dL (ref 22.0–69.5)

## 2022-02-17 LAB — PSA: Prostate Specific Ag, Serum: 7.6 ng/mL — ABNORMAL HIGH (ref 0.0–4.0)

## 2022-02-17 LAB — CEA: CEA: 1.7 ng/mL (ref 0.0–4.7)

## 2022-02-17 LAB — AFP TUMOR MARKER: AFP, Serum, Tumor Marker: <1.8 ng/mL (ref 0.0–6.4)

## 2022-02-17 LAB — AMYLASE: Amylase: 34 U/L (ref 31–110)

## 2022-02-17 LAB — CA 19-9 (SERIAL): CA 19-9: 19 U/mL (ref 0–35)

## 2022-02-17 LAB — MAGNESIUM: Magnesium: 1.7 mg/dL (ref 1.7–2.4)

## 2022-02-17 MED ORDER — POTASSIUM CHLORIDE 20 MEQ PO PACK
40.0000 meq | PACK | Freq: Once | ORAL | Status: AC
  Administered 2022-02-17: 40 meq via ORAL
  Filled 2022-02-17: qty 2

## 2022-02-17 MED ORDER — ONDANSETRON HCL 4 MG PO TABS: 4.0000 mg | ORAL_TABLET | Freq: Four times a day (QID) | ORAL | 0 refills | Status: AC | PRN

## 2022-02-17 MED ORDER — POTASSIUM CHLORIDE 20 MEQ PO PACK
40.0000 meq | PACK | Freq: Once | ORAL | Status: DC
  Filled 2022-02-17: qty 2

## 2022-02-17 MED ORDER — KCL IN DEXTROSE-NACL 20-5-0.45 MEQ/L-%-% IV SOLN
INTRAVENOUS | Status: DC
  Filled 2022-02-17 (×2): qty 1000

## 2022-02-17 NOTE — Care Management Important Message (Signed)
Important Message ? ?Patient Details  ?Name: Benjamin Walls ?MRN: [id number] ?Date of Birth: 1929/11/15 ? ? ?Medicare Important Message Given:  N/A - LOS <3 / Initial given by admissions ? ? ? ? ?Dannette Barbara ?02/17/2022, 12:38 PM ?

## 2022-02-17 NOTE — Progress Notes (Signed)
MRI called to see if patient was willing to come down for MRI. Nurse went to patient room and asked and patient refused MRI again.  ?

## 2022-02-17 NOTE — Plan of Care (Signed)

## 2022-02-17 NOTE — Telephone Encounter (Signed)
Copied from [place] [phone]. Topic: General - Other ?>> Feb 17, 2022  2:19 PM Valere Dross wrote: ?Reason for CRM: Alyse Low from [facility] [phone] called in stating if PCP could be attending Physician and to see about getting the pt some Hospice Services. Please advise ?

## 2022-02-17 NOTE — Progress Notes (Signed)
[facility] Hospital Liaison note: ? ?Follow up on new referral for [facility] hospice services at home. ?Writer met at beside with patient and spoke to Mrs. Garlick via telephone to initiate education regarding hospice services, philosophy and team approach to care with understanding voiced. ?Hospice eligibility has been confirmed. ? ?DME needs discussed: hospital bed and overbed table have been ordered for delivery this afternoon. ?Plan is for Mr. Lococo to go home via PMV today once bed is in place. ?Hospital care team has been updated. ? ?Please have prescriptions and signed out of facility DNR in place at discharge. ? ?Thank you for the opportunity to be involved in the care of this patient and his family. ? ?Karen Robertson BSN, RN, CHPN ?Hospital Liaison ?[facility] ?[phone] ? ?

## 2022-02-17 NOTE — Progress Notes (Signed)
Council Mechanic to be D/C'd Home per MD order.  Discussed prescriptions and follow up appointments with the patient. Prescriptions given to patient, medication list explained in detail. Pt verbalized understanding. ? ?Allergies as of 02/17/2022   ? ?   Reactions  ? Lotensin  [benazepril Hcl]   ? Penicillins   ? ?  ? ?  ?Medication List  ?  ? ?STOP taking these medications   ? ?co-enzyme Q-10 30 MG capsule ?  ?cyproheptadine 4 MG tablet ?Commonly known as: PERIACTIN ?  ?lovastatin 20 MG tablet ?Commonly known as: MEVACOR ?  ?MULTIVITAMIN PO ?  ? ?  ? ?TAKE these medications   ? ?amLODipine 2.5 MG tablet ?Commonly known as: NORVASC ?TAKE 1 TABLET(2.5 MG) BY MOUTH DAILY ?  ?aspirin 81 MG tablet ?Take 1 tablet by mouth every other day. ?  ?losartan-hydrochlorothiazide 100-25 MG tablet ?Commonly known as: HYZAAR ?TAKE 1 TABLET BY MOUTH DAILY ?  ?mirtazapine 7.5 MG tablet ?Commonly known as: REMERON ?Take 1 tablet (7.5 mg total) by mouth at bedtime. ?  ?OKEEFFES WORKING HANDS EX ?Apply topically daily. ?  ?ondansetron 4 MG tablet ?Commonly known as: ZOFRAN ?Take 1 tablet (4 mg total) by mouth every 6 (six) hours as needed for nausea. ?  ? ?  ? ? ?Vitals:  ? 02/17/22 0502 02/17/22 0809  ?BP: (!) 159/71 (!) 155/65  ?Pulse: 68 74  ?Resp: 18 16  ?Temp: 98.1 ?F (36.7 ?C) 98 ?F (36.7 ?C)  ?SpO2: 97% 96%  ? ? ?Skin clean, dry and intact without evidence of skin break down, no evidence of skin tears noted. IV catheter discontinued intact. Site without signs and symptoms of complications. Dressing and pressure applied. Pt denies pain at this time. No complaints noted. ? ?An After Visit Summary was printed and given to the patient. ?Patient escorted via [place], and D/C home via private auto. ? ?Fuller Mandril, RN  ? ? ?

## 2022-02-17 NOTE — TOC Progression Note (Signed)
Transition of Care (TOC) - Progression Note  ? ? ?Patient Details  ?Name: Benjamin Walls ?MRN: [id number] ?Date of Birth: Jun 11, 1930 ? ?Transition of Care (TOC) CM/SW Contact  ?Laurena Slimmer, RN ?Phone Number: ?02/17/2022, 4:05 PM ? ?Clinical Narrative:    ?Bed delivered to home. Patient son transported home. TOC signing off.  ? ? ?  ?  ? ?Expected Discharge Plan and Services ?  ?  ?  ?  ?  ?Expected Discharge Date: 02/17/22               ?  ?  ?  ?  ?  ?  ?  ?  ?  ?  ? ? ?Social Determinants of Health (SDOH) Interventions ?  ? ?Readmission Risk Interventions ?   ? View : No data to display.  ?  ?  ?  ? ? ?

## 2022-02-17 NOTE — Progress Notes (Signed)
Per Mrs. Chevak bed had not arrived yet. Estimated time of delivery is between 1:00 to 3:00 pm. Mrs. Pickup stated she would call when hospital bed arrives. Son would leave thereafter to pick up patient.  ? ?Fuller Mandril, RN ? ?

## 2022-02-17 NOTE — Discharge Instructions (Signed)
Patient diagnosed with metastatic pancreatic cancer.  Options discussed. ?Patient is preferred comfort measures.  Patient refusing any intervention or work-up. ?Patient is being discharged home with home hospice ?

## 2022-02-17 NOTE — Discharge Summary (Signed)
Physician Discharge Summary  ?Benjamin Walls:[id number] DOB: [birth year] DOA: 02/15/2022 ? ?PCP: Birdie Sons, MD ? ?Admit date: 02/15/2022 ? ?Discharge date: 02/17/2022 ? ?Admitted From: Home. ? ?Disposition:  (Residential Hospice) ? ?Recommendations for Outpatient Follow-up:  ?Follow up with PCP in 1-2 weeks. ?Please obtain BMP/CBC in one week ?Patient is being discharged home with home hospice. ? ?Home Health: Home hospice ?Equipment/Devices:None ? ?Discharge Condition: Stable ?CODE STATUS:DNR ?Diet recommendation: Heart Healthy ? ?Brief Summary/ Hospital Course: ?This is 88 years old male with PMH significant for CAD, hypertension, hyperlipidemia, choledocholithiasis with prior sphincterectomy, pancreatic stent in 2010 who was hospitalized from 02/04/2022 to 02/06/2022 with a fall with work-up consist with  hypercalcemia, rhabdomyolysis, troponinemia in the 2000's secondary to dehydration and demand ischemia treated with hydration and discharged home with home health presented back in the ED due to dark stools,  progressive weakness and continued decreased p.o. intake for liquids and difficulty swallowing medications.  Work-up in the ED showed slightly elevated bilirubin.  Imaging shows right upper lobe segmental and subsegmental PE with right heart strain.  Common bile and pancreatic duct dilatation concerning for pancreatic or ampullary malignancy,  consider MRI/ERCP.  Right inguinal lymphadenopathy as well as extensive retroperitoneal lymphadenopathy suggestive of lymphoproliferative disorder versus metastasis  Peritoneal carcinomatosis.  Patient was admitted for further evaluation.  Oncology, Gastroenterology consulted.  Oncologist recommended right inguinal lymph node biopsy.  It was explained to the patient in detail about the severity of the condition, advanced stage of the cancer,  limited treatment options, Patient understood and stated he does not want any intervention,  he wants comfort measures.   MRCP, lymph node biopsy all canceled.  Palliative care consulted to discuss goals of care.  Patient is being discharged home with home hospice.  DME hospital bed has been delivered to the patient's home. ? ? ?Discharge Diagnoses:  ?Principal Problem: ?  Acute pulmonary embolism ([place]) ?Active Problems: ?  Melena ?  Peritoneal carcinomatosis ([name]) ?  Hyperbilirubinemia ?  Inadequate oral intake ?  AKI (acute kidney injury) ([place]) ?  Hypercalcemia ?  Hypokalemia ?  Elevated troponin ?  Choledocholithiasis s/p splint sphincterotomy and pancreatic stent 2010 ?  Retroperitoneal lymphadenopathy, suspicious for malignancy ?  Generalized weakness ?  Protein calorie malnutrition (Benjamin Walls) ?  Pancreatic mass ? ?Acute pulmonary embolism (Benjamin Walls) ?CTA chest shows subsegmental PE in right upper lobe and with right heart strain. ?Patient is at high risk for PE given suspected malignancy but in the setting of melena, we will hold off on systemic anticoagulation at this time.  Continue to monitor H&H.   ?Patient having no overt symptoms of PE such as tachycardia, shortness of breath or hypoxia. ?Given  dark stools, not a candidate for anticoagulation. ?  ?Peritoneal carcinomatosis (Benjamin Walls) ?Oncology is consulted.   ?Recommended right inguinal lymph node biopsy by IR. ?Patient refusing all procedures.  Patient prefers comfort care. ?  ?Melena ?Patient presented with dark stool, Hemoccult+ ?Hemoglobin remains stable, 12.3> 11.5. ?Continue IV Protonix and IV hydration. ?Continue to monitor H&H and transfuse as needed. ?GI is consulted, patient is refusing all interventions. ?H&H remains stable. ?  ?  ?AKI (acute kidney injury) (Benjamin Walls) ?Continue IV hydration.  Monitor serum creatinine. ?Avoid nephrotoxic medications ?  ?Inadequate oral intake ?Continue IV hydration, speech evaluation ?Continue n.p.o. as patient may require speech evaluation/ intervention. ?  ?Hyperbilirubinemia ?Total bilirubin was 1.6 up from 0.8 from 1 day prior. ?Common bile and  pancreatic duct dilatation concerning for underlying proximal  pancreatic or ampullary malignancy. Consider MRI pancreatic protocol further evaluation. ?GI consulted to evaluate for ERCP. ?Patient refused to have MRCP and ERCP. ?Palliative care consulted to discuss goals of care. ?  ?Hypokalemia ?Replaced.  Continue to monitor ?  ?Hypercalcemia ?Secondary to suspected malignancy ?IV hydration ?  ?Elevated troponin ?Troponin 9 1x 2 and likely demand ischemia.   ?During recent hospitalization troponin was in the 2000's, believed secondary to rhabdo. ?Patient denies any chest pain. ?  ?Protein calorie malnutrition (Benjamin Walls) ?Dietary consult for further specification of degree of protein calorie malnutrition ?  ?Generalized weakness ?Dietary and TOC consults ?  ?Retroperitoneal lymphadenopathy, suspicious for malignancy ?Oncology consulted.  Palliative care might be appropriate in view of patient's advanced age ?  ?Choledocholithiasis s/p splint sphincterotomy and pancreatic stent 2010 ?History of ERCP in 2010 with sphincterotomy and pancreatic stent placement.  No prior cholecystectomy. ?  ?  ? ?Discharge Instructions ? ?Discharge Instructions   ? ? Call MD for:  difficulty breathing, headache or visual disturbances   Complete by: As directed ?  ? Call MD for:  persistant dizziness or light-headedness   Complete by: As directed ?  ? Call MD for:  persistant nausea and vomiting   Complete by: As directed ?  ? Diet - low sodium heart healthy   Complete by: As directed ?  ? Diet Carb Modified   Complete by: As directed ?  ? Discharge instructions   Complete by: As directed ?  ? Patient diagnosed with metastatic pancreatic cancer.  Options discussed. ?Patient is preferred comfort measures.  Patient refusing any intervention or work-up. ?Patient is being discharged home with home hospice  ? Increase activity slowly   Complete by: As directed ?  ? ?  ? ?Allergies as of 02/17/2022   ? ?   Reactions  ? Lotensin  [benazepril Hcl]   ?  Penicillins   ? ?  ? ?  ?Medication List  ?  ? ?STOP taking these medications   ? ?co-enzyme Q-10 30 MG capsule ?  ?cyproheptadine 4 MG tablet ?Commonly known as: PERIACTIN ?  ?lovastatin 20 MG tablet ?Commonly known as: MEVACOR ?  ?MULTIVITAMIN PO ?  ? ?  ? ?TAKE these medications   ? ?amLODipine 2.5 MG tablet ?Commonly known as: NORVASC ?TAKE 1 TABLET(2.5 MG) BY MOUTH DAILY ?  ?aspirin 81 MG tablet ?Take 1 tablet by mouth every other day. ?  ?losartan-hydrochlorothiazide 100-25 MG tablet ?Commonly known as: HYZAAR ?TAKE 1 TABLET BY MOUTH DAILY ?  ?mirtazapine 7.5 MG tablet ?Commonly known as: REMERON ?Take 1 tablet (7.5 mg total) by mouth at bedtime. ?  ?OKEEFFES WORKING HANDS EX ?Apply topically daily. ?  ?ondansetron 4 MG tablet ?Commonly known as: ZOFRAN ?Take 1 tablet (4 mg total) by mouth every 6 (six) hours as needed for nausea. ?  ? ?  ? ? Follow-up Information   ? ? [facility] up in 1 week(s).   ?Specialty: Hospice Services ?Contact information: ?[address] ?[place] [address] ?[phone] ? ? ?  ?  ? ?  ?  ? ?  ? ?Allergies  ?Allergen Reactions  ? Lotensin  [Benazepril Hcl]   ? Penicillins   ? ? ?Consultations: ?Oncology ?Radiation oncology ?Gastroenterology ? ? ?Procedures/Studies: ?DG Chest 2 View ? ?Result Date: 02/04/2022 ?CLINICAL DATA:  Trauma, fall, generalized weakness EXAM: CHEST - 2 VIEW COMPARISON:  None. FINDINGS: The heart size and mediastinal contours are within normal limits. Both lungs are  clear. The visualized skeletal structures are unremarkable. IMPRESSION: No active cardiopulmonary disease. Electronically Signed   By: Elmer Picker M.D.   On: 02/04/2022 10:32  ? ?CT HEAD WO CONTRAST (5MM) ? ?Result Date: 02/04/2022 ?CLINICAL DATA:  88 year old male with head and neck pain following fall. Initial encounter. EXAM: CT HEAD WITHOUT CONTRAST CT CERVICAL SPINE WITHOUT CONTRAST TECHNIQUE: Multidetector CT imaging of the head and cervical spine was performed  following the standard protocol without intravenous contrast. Multiplanar CT image reconstructions of the cervical spine were also generated. RADIATION DOSE REDUCTION: This exam was performed according t

## 2022-02-18 DIAGNOSIS — I248 Other forms of acute ischemic heart disease: Secondary | ICD-10-CM | POA: Diagnosis not present

## 2022-02-18 DIAGNOSIS — C786 Secondary malignant neoplasm of retroperitoneum and peritoneum: Secondary | ICD-10-CM | POA: Diagnosis not present

## 2022-02-18 DIAGNOSIS — R1310 Dysphagia, unspecified: Secondary | ICD-10-CM | POA: Diagnosis not present

## 2022-02-18 DIAGNOSIS — R590 Localized enlarged lymph nodes: Secondary | ICD-10-CM | POA: Diagnosis not present

## 2022-02-18 DIAGNOSIS — I2510 Atherosclerotic heart disease of native coronary artery without angina pectoris: Secondary | ICD-10-CM | POA: Diagnosis not present

## 2022-02-18 DIAGNOSIS — I2699 Other pulmonary embolism without acute cor pulmonale: Secondary | ICD-10-CM | POA: Diagnosis not present

## 2022-02-18 DIAGNOSIS — Z681 Body mass index (BMI) 19 or less, adult: Secondary | ICD-10-CM | POA: Diagnosis not present

## 2022-02-18 DIAGNOSIS — E46 Unspecified protein-calorie malnutrition: Secondary | ICD-10-CM | POA: Diagnosis not present

## 2022-02-18 DIAGNOSIS — R296 Repeated falls: Secondary | ICD-10-CM | POA: Diagnosis not present

## 2022-02-18 DIAGNOSIS — K8050 Calculus of bile duct without cholangitis or cholecystitis without obstruction: Secondary | ICD-10-CM | POA: Diagnosis not present

## 2022-02-18 DIAGNOSIS — C259 Malignant neoplasm of pancreas, unspecified: Secondary | ICD-10-CM | POA: Diagnosis not present

## 2022-02-18 DIAGNOSIS — E876 Hypokalemia: Secondary | ICD-10-CM | POA: Diagnosis not present

## 2022-02-20 DIAGNOSIS — C786 Secondary malignant neoplasm of retroperitoneum and peritoneum: Secondary | ICD-10-CM | POA: Diagnosis not present

## 2022-02-20 DIAGNOSIS — R296 Repeated falls: Secondary | ICD-10-CM | POA: Diagnosis not present

## 2022-02-20 DIAGNOSIS — I2699 Other pulmonary embolism without acute cor pulmonale: Secondary | ICD-10-CM | POA: Diagnosis not present

## 2022-02-20 DIAGNOSIS — E46 Unspecified protein-calorie malnutrition: Secondary | ICD-10-CM | POA: Diagnosis not present

## 2022-02-20 DIAGNOSIS — R590 Localized enlarged lymph nodes: Secondary | ICD-10-CM | POA: Diagnosis not present

## 2022-02-20 DIAGNOSIS — C259 Malignant neoplasm of pancreas, unspecified: Secondary | ICD-10-CM | POA: Diagnosis not present

## 2022-02-20 NOTE — Telephone Encounter (Signed)
Yes, he needs hospice services ?

## 2022-02-20 NOTE — Telephone Encounter (Signed)
I returned call to [facility] and advised of message below.  ?

## 2022-02-22 ENCOUNTER — Other Ambulatory Visit: Payer: Medicare Other

## 2022-02-22 DIAGNOSIS — C259 Malignant neoplasm of pancreas, unspecified: Secondary | ICD-10-CM | POA: Diagnosis not present

## 2022-02-22 DIAGNOSIS — R296 Repeated falls: Secondary | ICD-10-CM | POA: Diagnosis not present

## 2022-02-22 DIAGNOSIS — C786 Secondary malignant neoplasm of retroperitoneum and peritoneum: Secondary | ICD-10-CM | POA: Diagnosis not present

## 2022-02-22 DIAGNOSIS — I2699 Other pulmonary embolism without acute cor pulmonale: Secondary | ICD-10-CM | POA: Diagnosis not present

## 2022-02-22 DIAGNOSIS — E46 Unspecified protein-calorie malnutrition: Secondary | ICD-10-CM | POA: Diagnosis not present

## 2022-02-22 DIAGNOSIS — R590 Localized enlarged lymph nodes: Secondary | ICD-10-CM | POA: Diagnosis not present

## 2022-02-23 DIAGNOSIS — R590 Localized enlarged lymph nodes: Secondary | ICD-10-CM | POA: Diagnosis not present

## 2022-02-23 DIAGNOSIS — C259 Malignant neoplasm of pancreas, unspecified: Secondary | ICD-10-CM | POA: Diagnosis not present

## 2022-02-23 DIAGNOSIS — R296 Repeated falls: Secondary | ICD-10-CM | POA: Diagnosis not present

## 2022-02-23 DIAGNOSIS — C786 Secondary malignant neoplasm of retroperitoneum and peritoneum: Secondary | ICD-10-CM | POA: Diagnosis not present

## 2022-02-23 DIAGNOSIS — E46 Unspecified protein-calorie malnutrition: Secondary | ICD-10-CM | POA: Diagnosis not present

## 2022-02-23 DIAGNOSIS — I2699 Other pulmonary embolism without acute cor pulmonale: Secondary | ICD-10-CM | POA: Diagnosis not present

## 2022-02-24 DIAGNOSIS — C786 Secondary malignant neoplasm of retroperitoneum and peritoneum: Secondary | ICD-10-CM | POA: Diagnosis not present

## 2022-02-24 DIAGNOSIS — R590 Localized enlarged lymph nodes: Secondary | ICD-10-CM | POA: Diagnosis not present

## 2022-02-24 DIAGNOSIS — I2699 Other pulmonary embolism without acute cor pulmonale: Secondary | ICD-10-CM | POA: Diagnosis not present

## 2022-02-24 DIAGNOSIS — R296 Repeated falls: Secondary | ICD-10-CM | POA: Diagnosis not present

## 2022-02-24 DIAGNOSIS — C259 Malignant neoplasm of pancreas, unspecified: Secondary | ICD-10-CM | POA: Diagnosis not present

## 2022-02-24 DIAGNOSIS — E46 Unspecified protein-calorie malnutrition: Secondary | ICD-10-CM | POA: Diagnosis not present

## 2022-02-26 DIAGNOSIS — C259 Malignant neoplasm of pancreas, unspecified: Secondary | ICD-10-CM | POA: Diagnosis not present

## 2022-02-26 DIAGNOSIS — E46 Unspecified protein-calorie malnutrition: Secondary | ICD-10-CM | POA: Diagnosis not present

## 2022-02-26 DIAGNOSIS — C786 Secondary malignant neoplasm of retroperitoneum and peritoneum: Secondary | ICD-10-CM | POA: Diagnosis not present

## 2022-02-26 DIAGNOSIS — I2699 Other pulmonary embolism without acute cor pulmonale: Secondary | ICD-10-CM | POA: Diagnosis not present

## 2022-02-26 DIAGNOSIS — R296 Repeated falls: Secondary | ICD-10-CM | POA: Diagnosis not present

## 2022-02-26 DIAGNOSIS — R590 Localized enlarged lymph nodes: Secondary | ICD-10-CM | POA: Diagnosis not present

## 2022-02-27 DIAGNOSIS — R296 Repeated falls: Secondary | ICD-10-CM | POA: Diagnosis not present

## 2022-02-27 DIAGNOSIS — R590 Localized enlarged lymph nodes: Secondary | ICD-10-CM | POA: Diagnosis not present

## 2022-02-27 DIAGNOSIS — I2699 Other pulmonary embolism without acute cor pulmonale: Secondary | ICD-10-CM | POA: Diagnosis not present

## 2022-02-27 DIAGNOSIS — C259 Malignant neoplasm of pancreas, unspecified: Secondary | ICD-10-CM | POA: Diagnosis not present

## 2022-02-27 DIAGNOSIS — E46 Unspecified protein-calorie malnutrition: Secondary | ICD-10-CM | POA: Diagnosis not present

## 2022-02-27 DIAGNOSIS — C786 Secondary malignant neoplasm of retroperitoneum and peritoneum: Secondary | ICD-10-CM | POA: Diagnosis not present

## 2022-02-28 DIAGNOSIS — R590 Localized enlarged lymph nodes: Secondary | ICD-10-CM | POA: Diagnosis not present

## 2022-02-28 DIAGNOSIS — C259 Malignant neoplasm of pancreas, unspecified: Secondary | ICD-10-CM | POA: Diagnosis not present

## 2022-02-28 DIAGNOSIS — R296 Repeated falls: Secondary | ICD-10-CM | POA: Diagnosis not present

## 2022-02-28 DIAGNOSIS — E46 Unspecified protein-calorie malnutrition: Secondary | ICD-10-CM | POA: Diagnosis not present

## 2022-02-28 DIAGNOSIS — I2699 Other pulmonary embolism without acute cor pulmonale: Secondary | ICD-10-CM | POA: Diagnosis not present

## 2022-02-28 DIAGNOSIS — C786 Secondary malignant neoplasm of retroperitoneum and peritoneum: Secondary | ICD-10-CM | POA: Diagnosis not present

## 2022-03-06 DEATH — deceased

## 2023-12-20 IMAGING — US US THYROID
1 series · 13 of 25 positions shown · non-contrast
Comparison: CT from previous day

CLINICAL DATA: Right nodule noted on CT cervical spine

EXAM:
THYROID ULTRASOUND
TECHNIQUE: Ultrasound examination of the thyroid gland and adjacent soft
tissues was performed.

[Series 1: us thyroid · 13 of 72 slices shown]
[im 1/72]
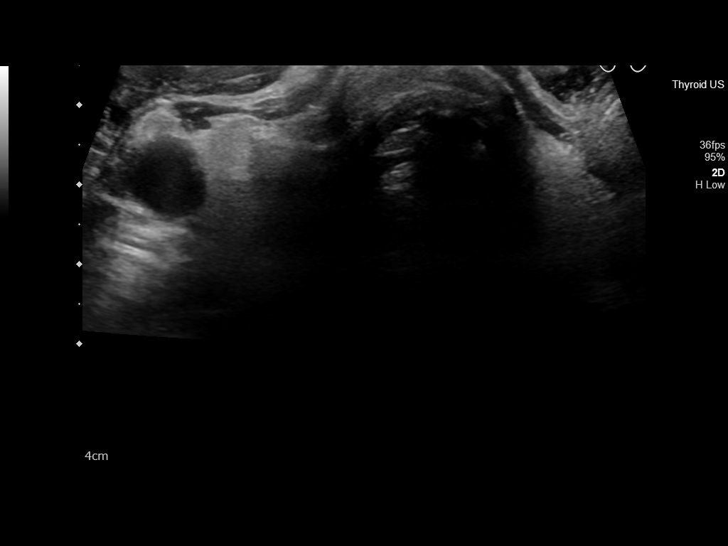
[im 6/72]
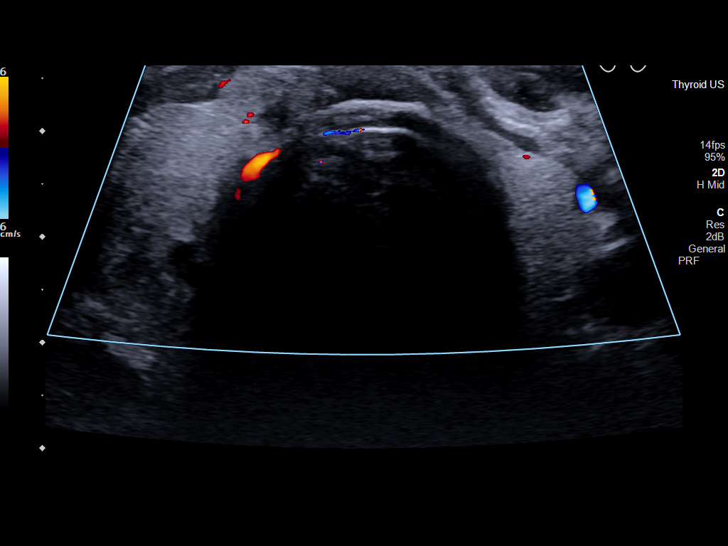
[im 12/72]
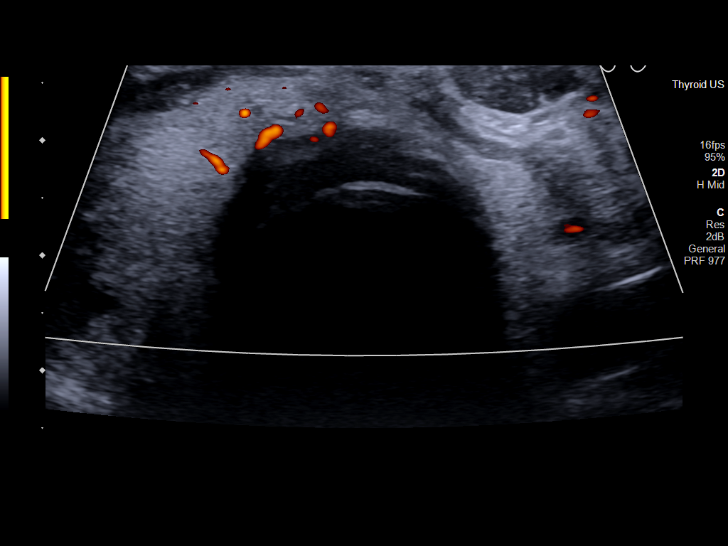
[im 18/72]
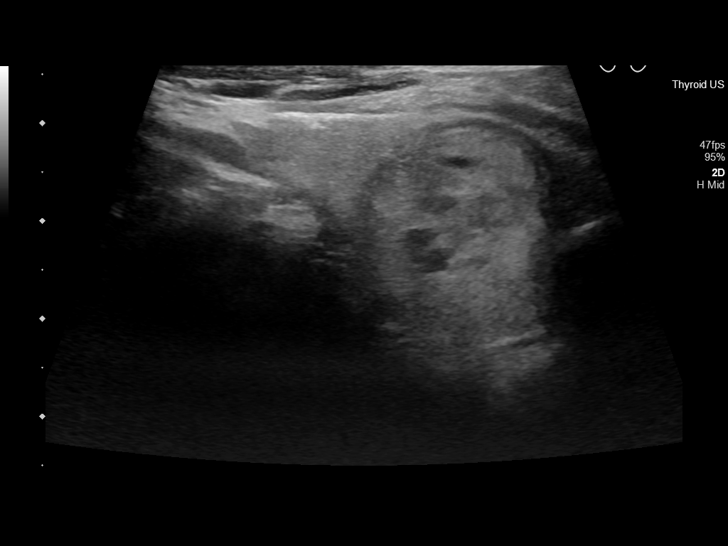
[im 24/72]
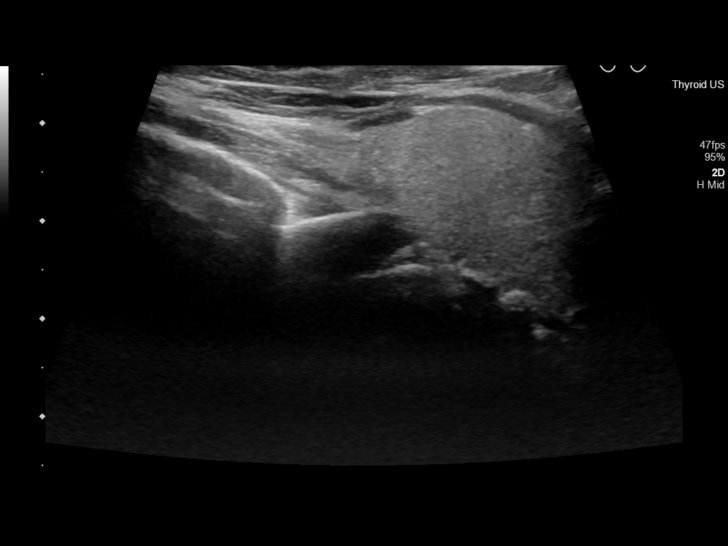
[im 30/72]
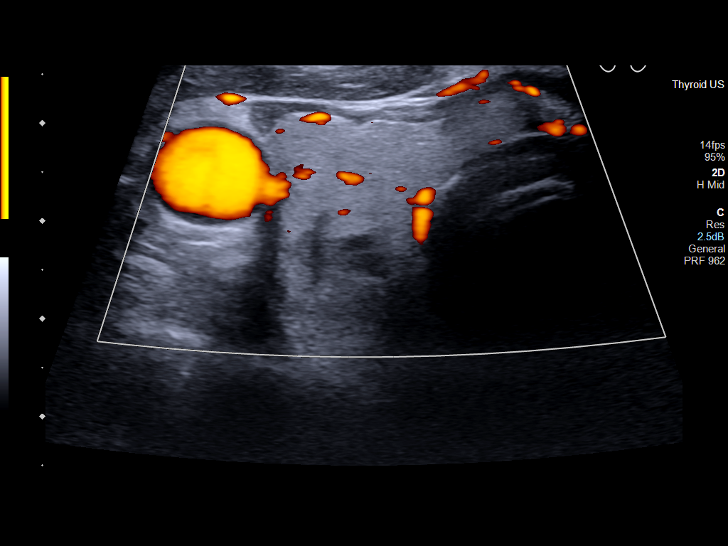
[im 36/72]
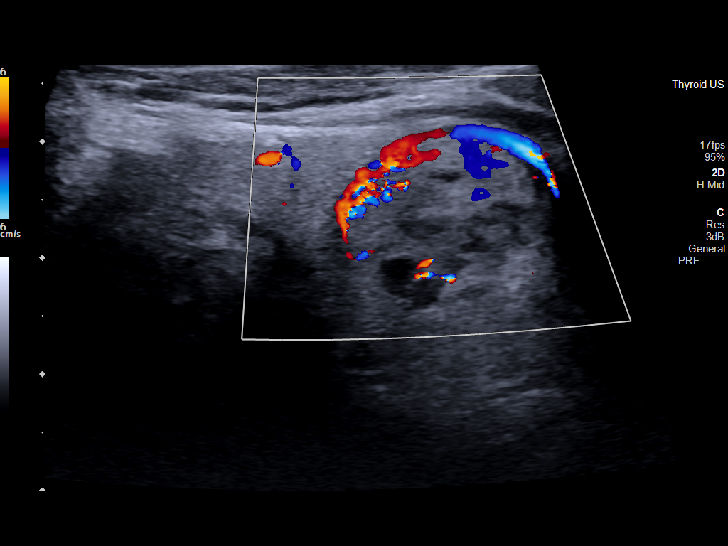
[im 42/72]
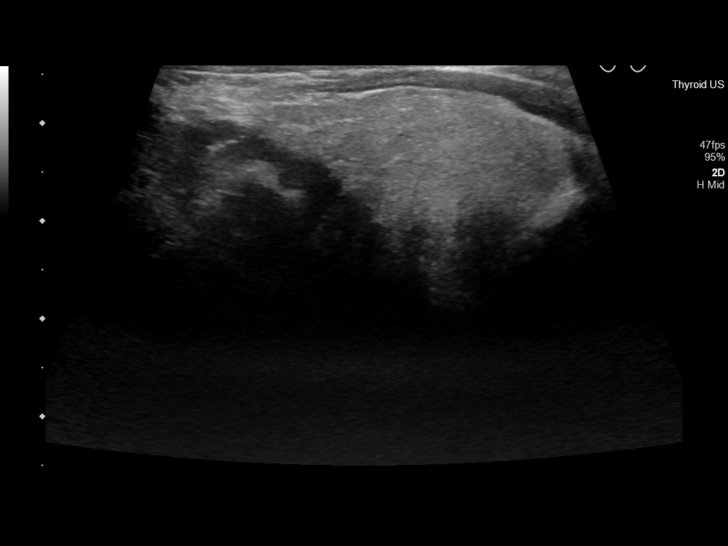
[im 48/72]
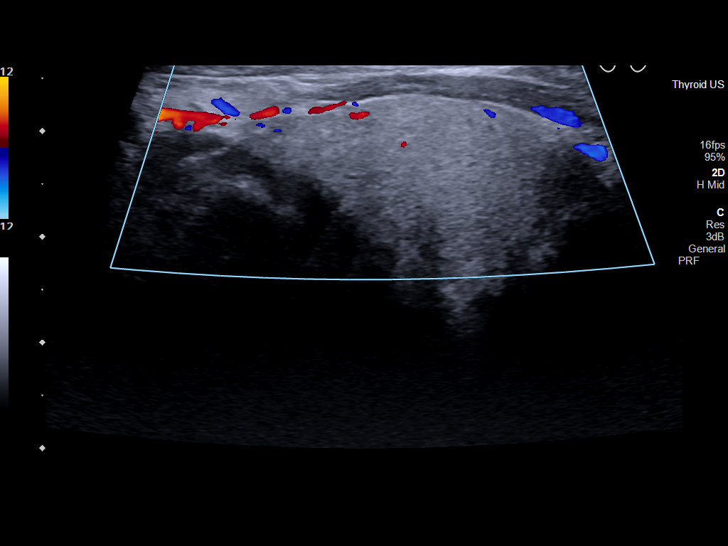
[im 54/72]
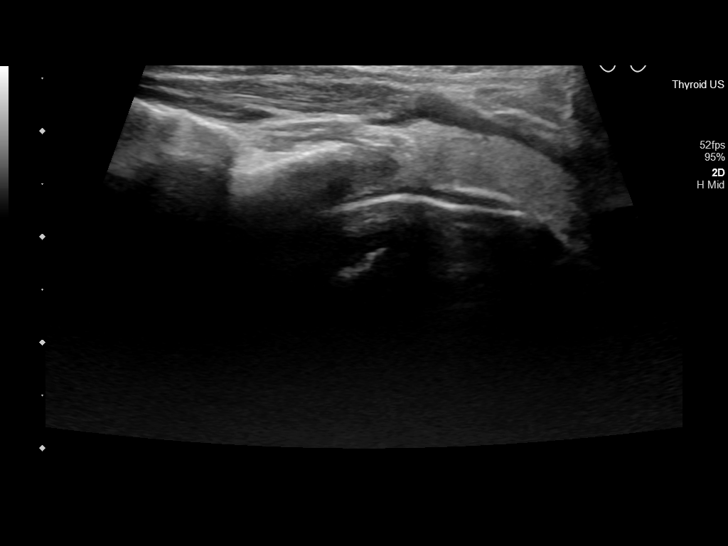
[im 60/72]
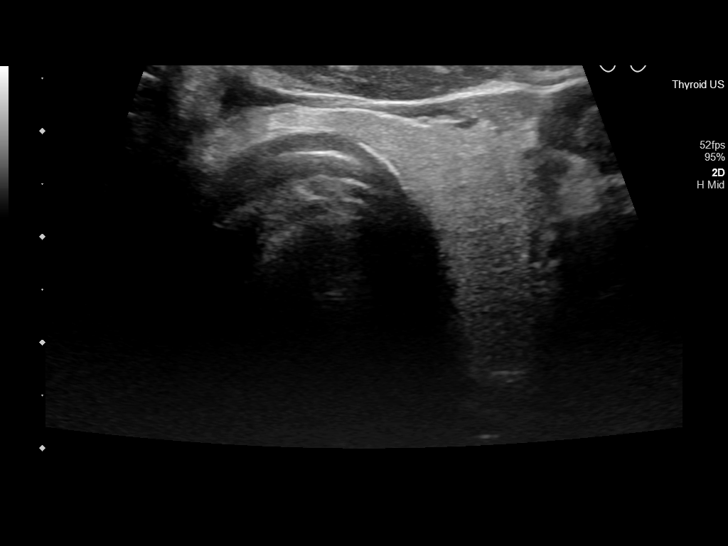
[im 66/72]
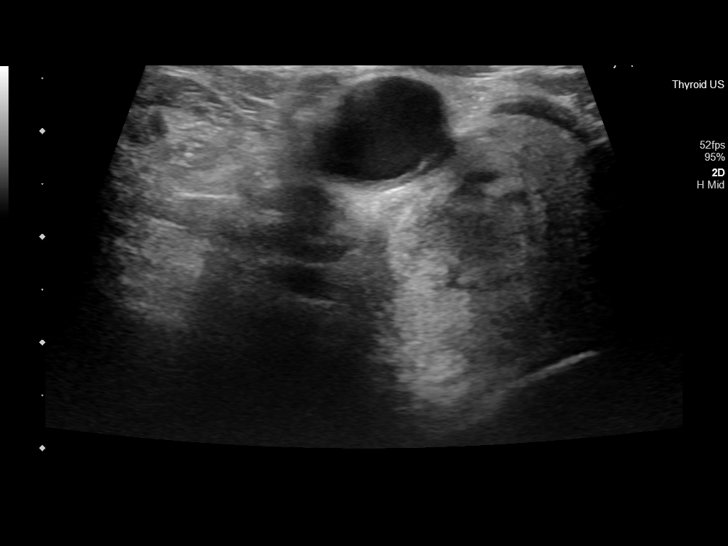
[im 72/72]
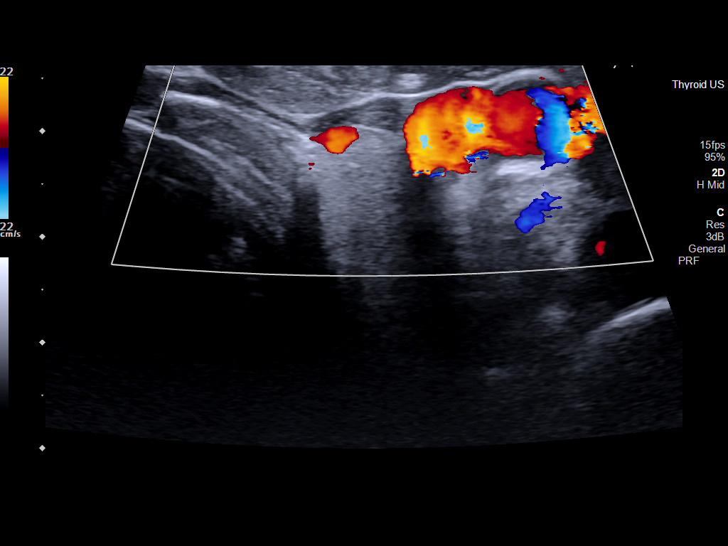

[13 of 25 positions shown; findings below may reference images not displayed]

FINDINGS: Parenchymal Echotexture: Mildly heterogenous

Isthmus: 0.5 cm thickness

Right lobe: 4.3 x 2.2 x 1.7 cm

Left lobe: 3 x 1.41 cm

_________________________________________________________

Estimated total number of nodules >/= 1 cm: 1

Number of spongiform nodules >/=  2 cm not described below (TR1): 0

Number of mixed cystic and solid nodules >/= 1.5 cm not described
below (TR2): 0

_________________________________________________________

Nodule # 1:

Location: Right; inferior

Maximum size: 1.9 cm; Other 2 dimensions: 1.9 x 1.6 cm

Composition: mixed cystic and solid (1)

Echogenicity: isoechoic (1)

Shape: taller-than-wide (3)

Margins: ill-defined (0)

Echogenic foci: none (0)

ACR TI-RADS total points: 5.

ACR TI-RADS risk category: TR 4.

ACR TI-RADS recommendations:

**Given size (>/= 1.5 cm) and appearance, fine needle aspiration of
this moderately suspicious nodule should be considered based on
TI-RADS criteria.

_________________________________________________________

No regional cervical adenopathy identified.
IMPRESSION: 1. Normal-sized thyroid with solitary right nodule. In the absence
of limited life expectancy or significant comorbidities conditions,
recommend FNA biopsy.

The above is in keeping with the ACR TI-RADS recommendations - [HOSPITAL] 4091;[DATE].

## 2023-12-30 IMAGING — CT CT CHEST-ABD-PELV W/ CM
2 of 5 series · 12 of 36 positions shown, 14 images · IV contrast (agent unspecified)
Comparison: History

CLINICAL DATA: Respiratory illness, nondiagnostic xray food also
getting stuck in lower chest, weight loss

EXAM:
CT CHEST, ABDOMEN, AND PELVIS WITH CONTRAST
TECHNIQUE: Multidetector CT imaging of the chest, abdomen and pelvis was
performed following the standard protocol during bolus
administration of intravenous contrast.

[Series 2: cap with · axial · 0.68mm/px · z∈[-598,-48]mm · 9 of 128 slices shown, 11 images]
[im 9/128  mediastinal]
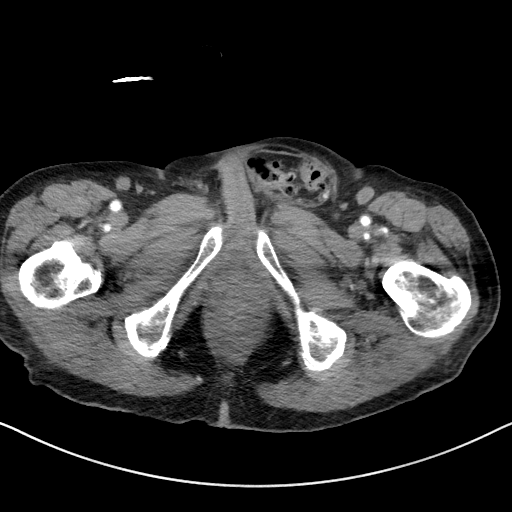
[im 9/128  bone]
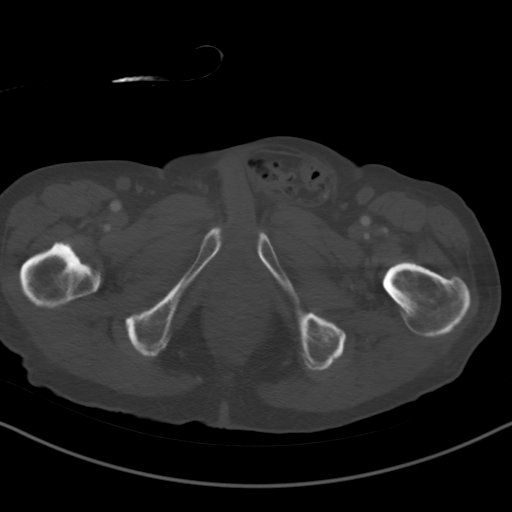
[im 26/128  mediastinal]
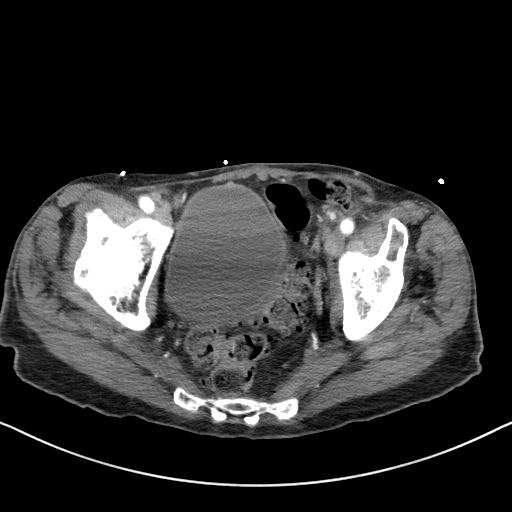
[im 34/128  mediastinal]
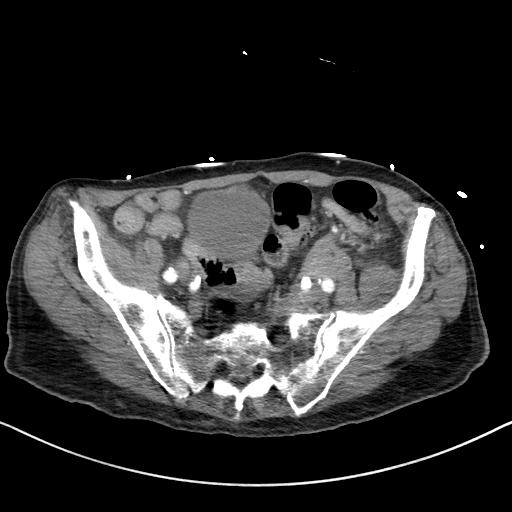
[im 51/128  mediastinal]
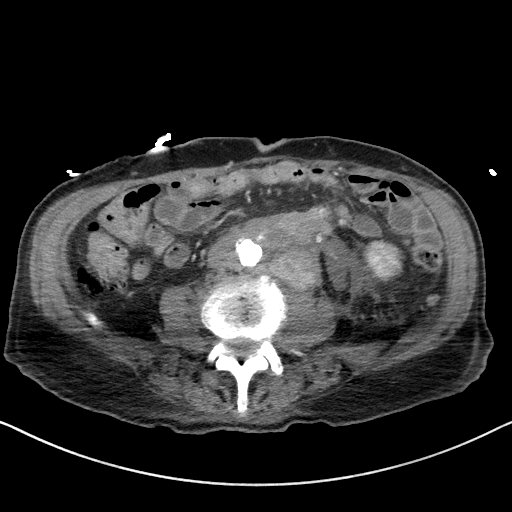
[im 68/128  mediastinal]
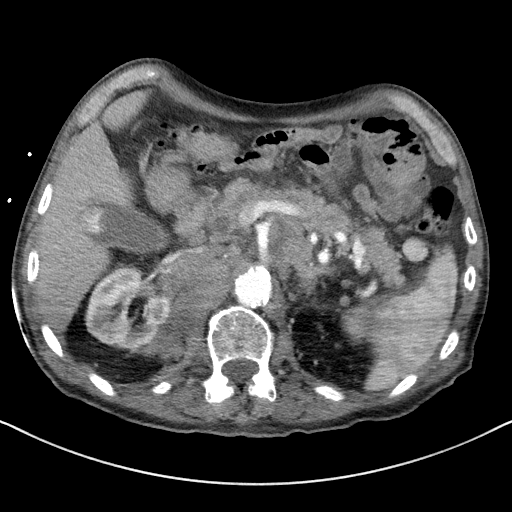
[im 77/128  mediastinal]
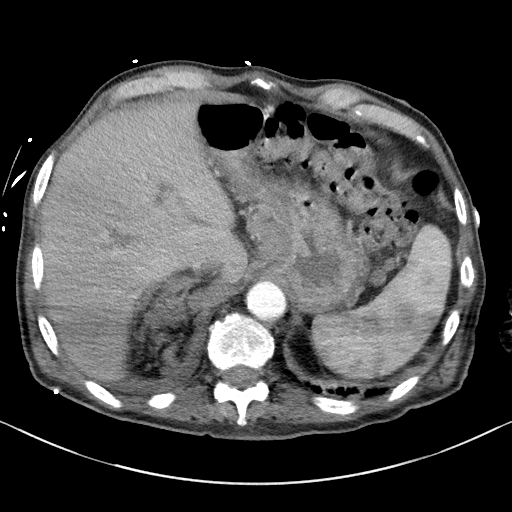
[im 94/128  mediastinal]
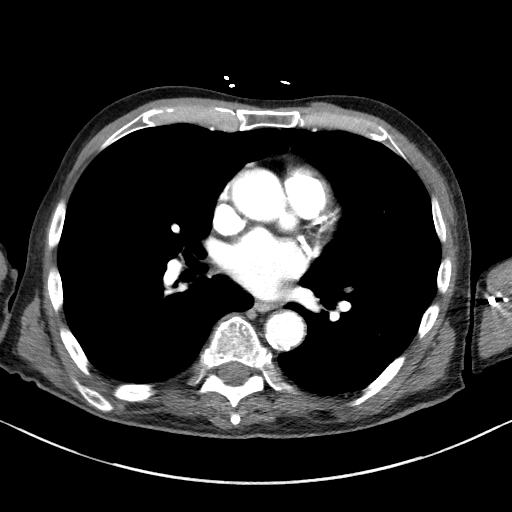
[im 102/128  mediastinal]
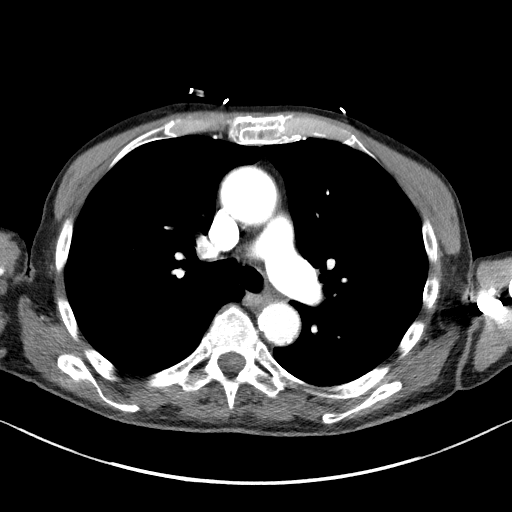
[im 119/128  mediastinal]
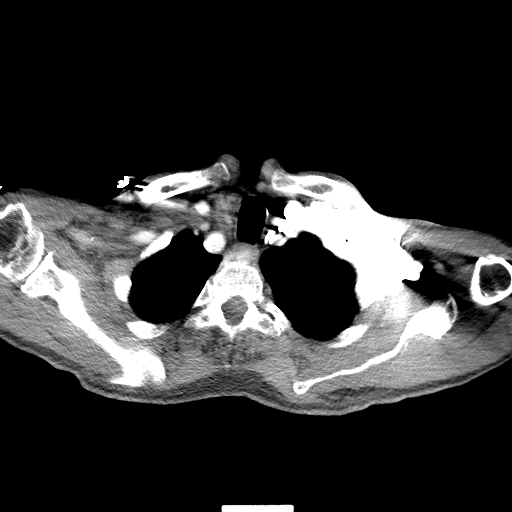
[im 119/128  bone]
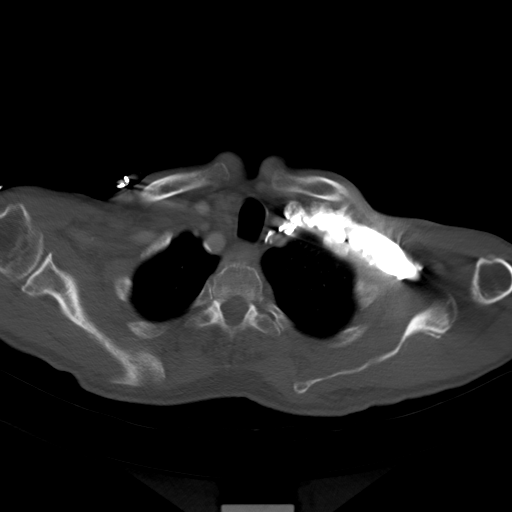

[Series 5: coronals · coronal · 0.74mm/px · 3 of 137 slices shown]
[im 28/137  mediastinal]
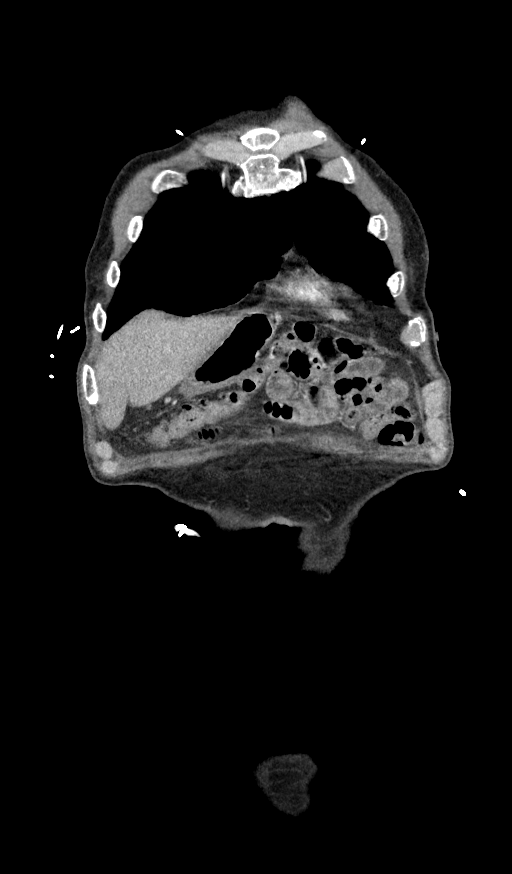
[im 55/137  mediastinal]
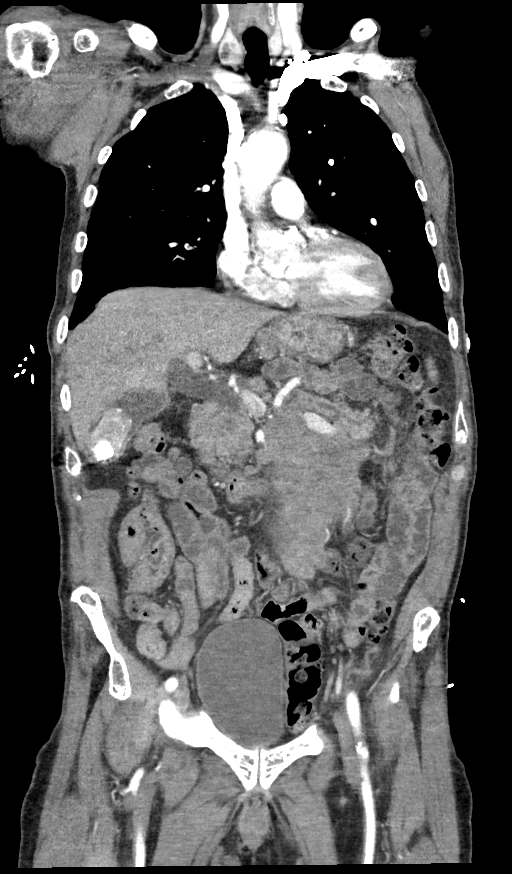
[im 82/137  mediastinal]
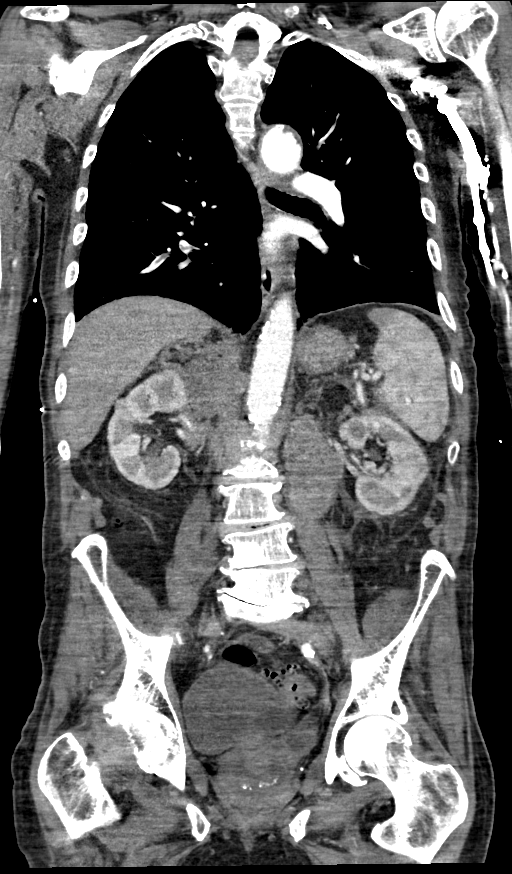

[12 of 36 positions shown; findings below may reference images not displayed]

RADIATION DOSE REDUCTION: This exam was performed according to the
departmental dose-optimization program which includes automated
exposure control, adjustment of the mA and/or kV according to
patient size and/or use of iterative reconstruction technique.

CONTRAST:  100mL OMNIPAQUE IOHEXOL 300 MG/ML  SOLN
FINDINGS: CT CHEST FINDINGS

Cardiovascular: Normal heart size. No significant pericardial
effusion. The thoracic aorta is normal in caliber. Moderate
atherosclerotic plaque of the thoracic aorta. Four-vessel coronary
artery calcifications. No central pulmonary embolus. There is a
right upper lobe pulmonary artery filling defect within the
segmental and subsegmental levels ([DATE]). Right lower lobe
subsegmental pulmonary embolus ([DATE]). Limited evaluation of the
subsegmental levels due to timing of contrast.

Mediastinum/Nodes: No enlarged mediastinal, hilar, or axillary lymph
nodes. Thyroid gland, trachea, and esophagus demonstrate no
significant findings.

Lungs/Pleura: Biapical pleural/pulmonary scarring. No focal
consolidation. No pulmonary nodule. No pulmonary mass. No pleural
effusion. No pneumothorax.

Musculoskeletal:

No chest wall abnormality.

No suspicious lytic or blastic osseous lesions. No acute displaced
fracture. Old healed sternal fracture.

CT ABDOMEN PELVIS FINDINGS

Hepatobiliary: No focal liver abnormality. Large calcified gallstone
measuring up to 4.8 cm. No gallbladder wall thickening or
pericholecystic fluid. Enlarged common bile duct measuring up to 2
cm. Borderline central intrahepatic biliary ductal dilatation.

Pancreas: Irregular proximal pancreatic contour with underlying mass
lesion not excluded. Normal pancreatic contour. No surrounding
inflammatory changes. Enlarged main pulmonary artery measuring up to
7 mm.

Spleen: Normal in size without focal abnormality.

Adrenals/Urinary Tract:

No adrenal nodule bilaterally.

Bilateral kidneys enhance symmetrically. Subcentimeter hypodensities
too small to characterize.

No hydronephrosis. No hydroureter. The left ureter is noted to be
encased within the lymphadenopathy. There is delayed excretion of
intravenous contrast on left kidney on the delayed view.

The urinary bladder is unremarkable.

Stomach/Bowel: Stomach is within normal limits. No evidence of bowel
wall thickening or dilatation. Colonic diverticulosis. Appendix
appears normal.

Vascular/Lymphatic

No abdominal aorta or iliac aneurysm. Severe atherosclerotic plaque
of the aorta and its branches.

Marked conglomerative, centrally necrotic, retroperitoneal
lymphadenopathy that appears to be inseparable from the pancreatic
body ([DATE] 3-67), inseparable from the right kidney ([DATE]), possibly
invading the inferior vena cava ([DATE] 2-66). Lymphadenopathy noted
to encase the right renal artery, above the superior mesenteric
artery and celiac artery, encase the inferior mesenteric artery.
Also encasement of the left iliac vasculature.

Right inguinal lymphadenopathy: 1.7 cm ([DATE]).

Reproductive: Prostate is enlarged measuring up to 5.2 cm.

Other: Likely peritoneal implants within bilateral upper lobes
([DATE], 58). No intraperitoneal free fluid. No intraperitoneal free
gas. No organized fluid collection.

Musculoskeletal:

Partially visualized small to moderate volume left inguinal hernia
containing a segment of proximal sigmoid colon.

No suspicious lytic or blastic osseous lesions. No acute displaced
fracture. Multilevel degenerative changes of the spine.
IMPRESSION: 1. Right upper lobe segmental and subsegmental as well as the right
lower lobe subsegmental pulmonary emboli. Evaluation of the
subsegmental level due to timing of contrast. Enlarged right to left
ventricular ratio (1.1) suggestive of right heart strain. No
pulmonary infarction.
2. Common bile and pancreatic duct dilatation concerning for
underlying proximal pancreatic or ampullary malignancy. Consider MRI
pancreatic protocol further evaluation.
3. Right inguinal lymphadenopathy as well as extensive
retroperitoneal lymphadenopathy suggestive of lymphoproliferative
disorder versus metastases. Lymphadenopathy appears to be
inseparable from the pancreatic body and right kidney.
Lymphadenopathy encases and abuts abdominal and pelvic vasculature
as described above, possibly invading the inferior vena cava. The
left ureter is noted to be in case within the lymphadenopathy with
delayed excretion of intravenous contrast compared to the right.
Consider PET-CT.
4. Peritoneal carcinomatosis.
5. Cholelithiasis (5 cm calcified stone) with no CT findings of
acute cholecystitis.
6. Colonic diverticulosis no findings of acute diverticulitis.
7. Prostatomegaly.
8. Partially visualized small to moderate volume left inguinal
hernia containing a segment of proximal sigmoid colon. No findings
suggest associated bowel obstruction or ischemia.
9. Aortic Atherosclerosis ([id number]) including four-vessel
coronary calcification.

These results were called by telephone at the time of interpretation
on 02/15/2022 at [DATE] to provider SHIR RAHMAN CSKN , who verbally
acknowledged these results.

## 2023-12-30 IMAGING — CT CT NECK W/ CM
4 of 5 series · 12 of 33 positions shown, 14 images · IV contrast (agent unspecified)
Comparison: None.

CLINICAL DATA: Initial evaluation for occult malignancy.

EXAM:
CT NECK WITH CONTRAST
TECHNIQUE: Multidetector CT imaging of the neck was performed using the
standard protocol following the bolus administration of intravenous
contrast.

[Series 4: axial bone · axial · 0.46mm/px · z∈[-42,+74]mm · 3 of 116 slices shown, 4 images]
[im 29/116  soft-tissue]
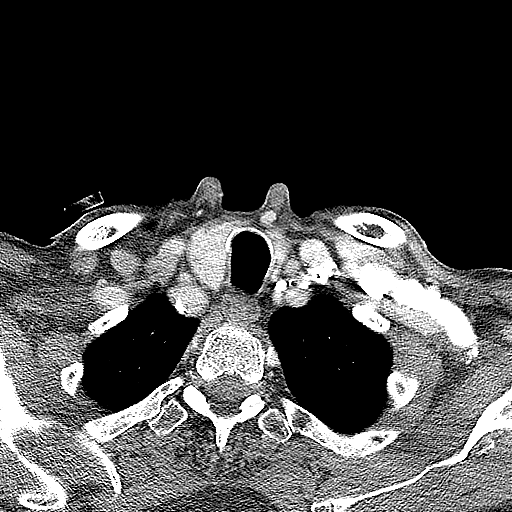
[im 29/116  bone]
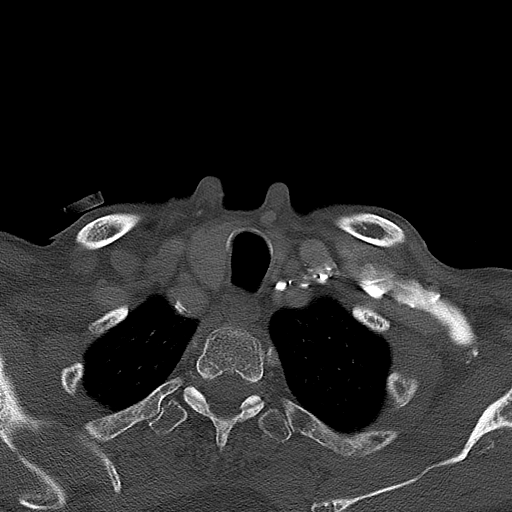
[im 58/116  bone]
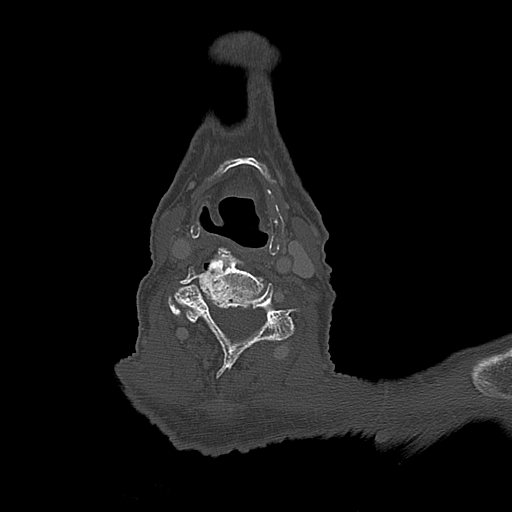
[im 87/116  bone]
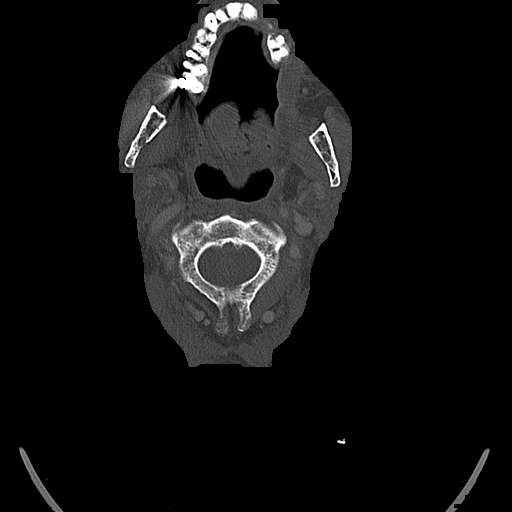

[Series 5: sag neck · sagittal · 0.46mm/px · 5 of 79 slices shown, 6 images]
[im 27/79  bone]
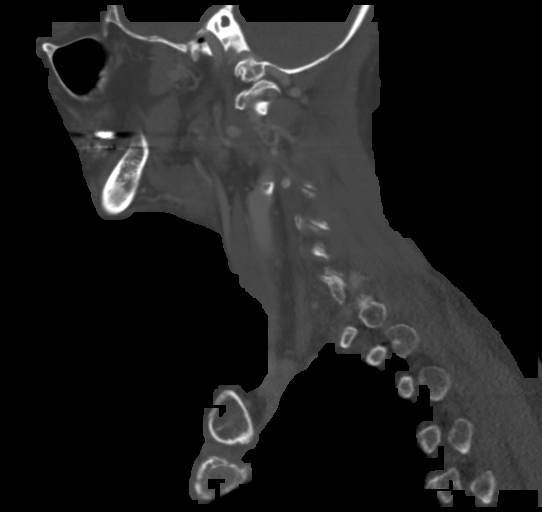
[im 33/79  bone]
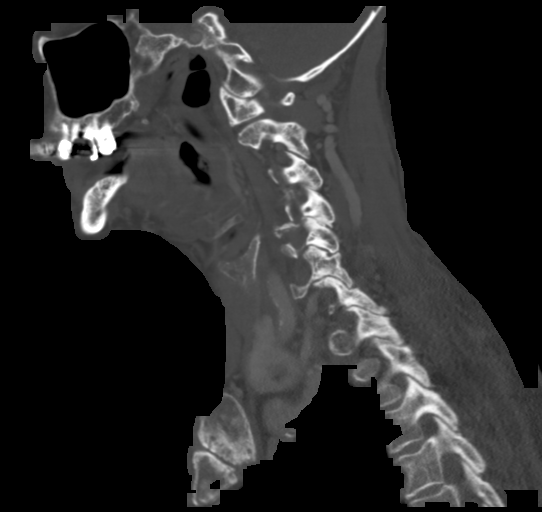
[im 40/79  soft-tissue]
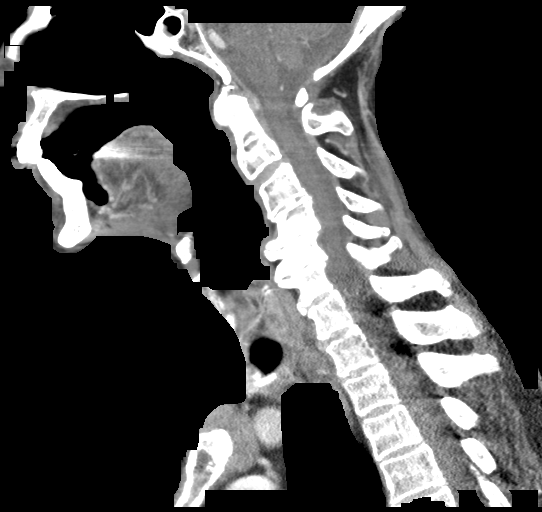
[im 40/79  bone]
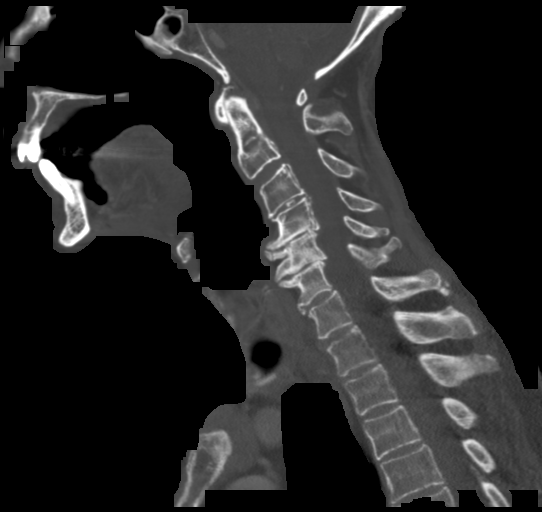
[im 46/79  bone]
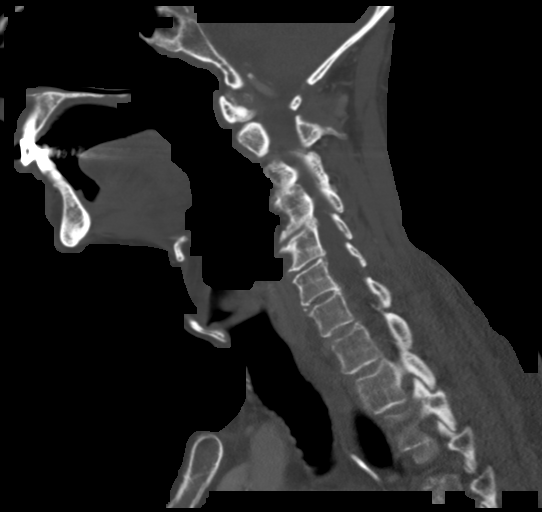
[im 53/79  bone]
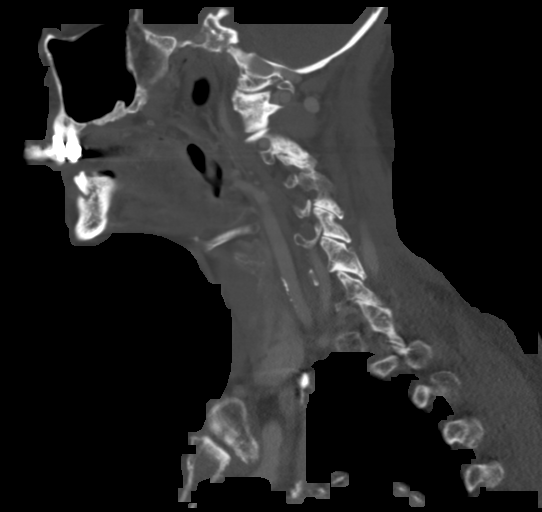

[Series 6: cor neck · coronal · 0.27mm/px · 3 of 125 slices shown]
[im 25/125  bone]
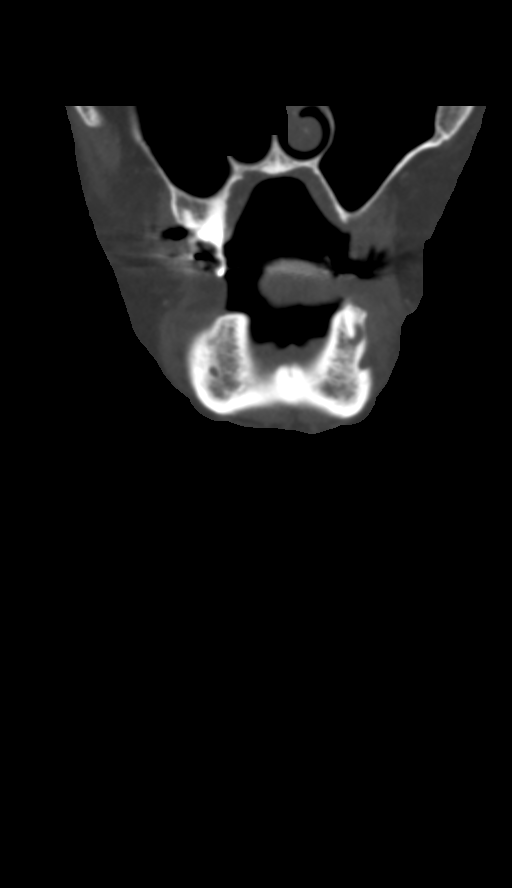
[im 50/125  bone]
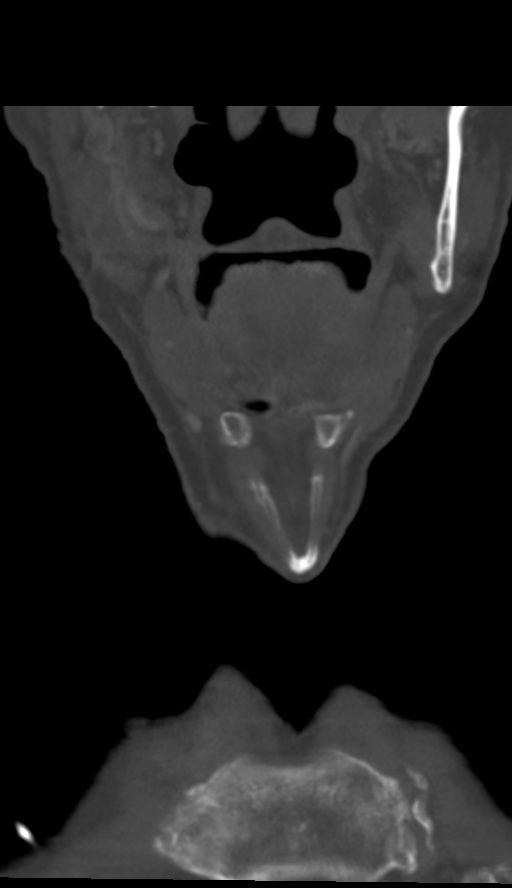
[im 75/125  bone]
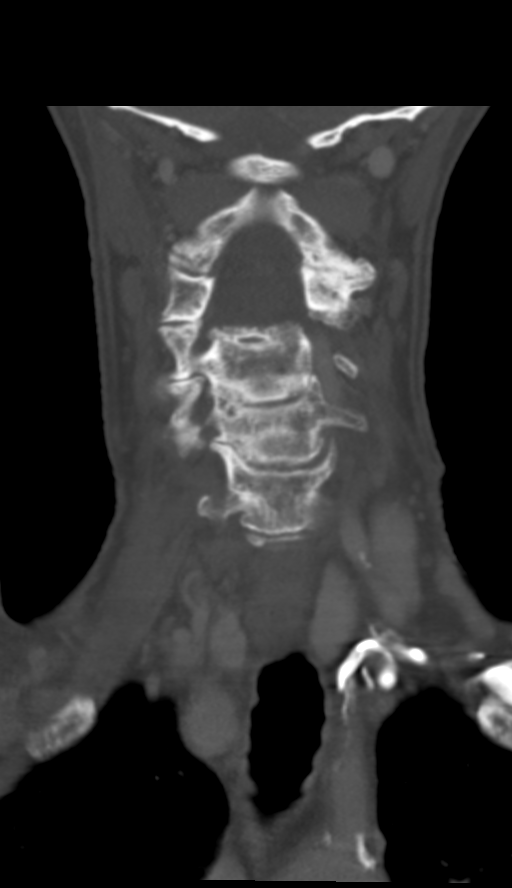

[Series 7: orthogonal (person_name) · axial · 0.26mm/px · 1 of 116 slices shown]
[im 29/116  bone]
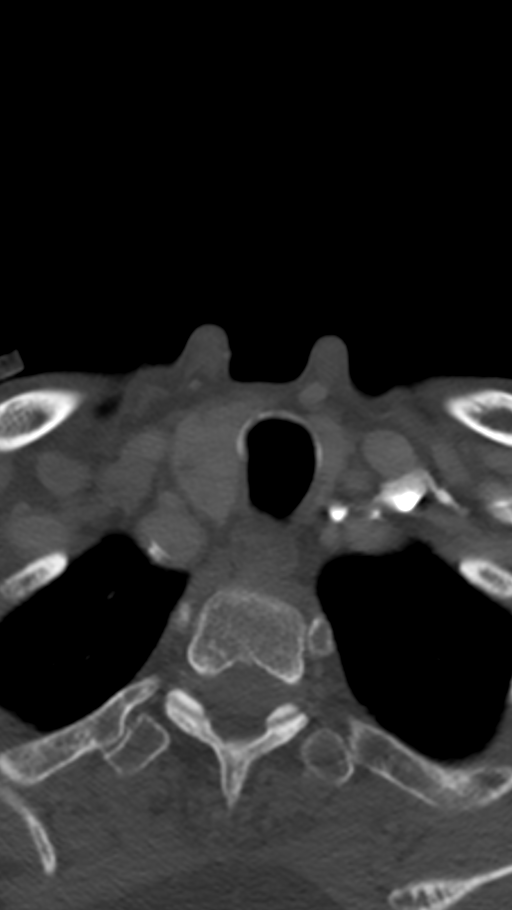

[12 of 33 positions shown; findings below may reference images not displayed]

RADIATION DOSE REDUCTION: This exam was performed according to the
departmental dose-optimization program which includes automated
exposure control, adjustment of the mA and/or kV according to
patient size and/or use of iterative reconstruction technique.

CONTRAST:  100mL OMNIPAQUE IOHEXOL 300 MG/ML  SOLN
FINDINGS: Pharynx and larynx: Oral cavity within normal limits without
discrete mass or collection. No acute abnormality about the
visualized dentition. No visible base of tongue mass. Oropharynx and
nasopharynx within normal limits. No retropharyngeal collection or
swelling. Epiglottis within normal limits. Vallecula clear.
Hypopharynx and supraglottic larynx within normal limits. Glottis
symmetric and normal. Subglottic airway clear. No visible discrete
mucosal or submucosal mass. Supraglottic airway somewhat patulous.
Visualized upper esophagus within normal limits.

Salivary glands: Salivary glands including the parotid and
submandibular glands are within normal limits.

Thyroid: 1.5 cm right thyroid nodule. This has been evaluated on
previous imaging. (ref: [HOSPITAL]. [DATE]): 143-50).

Lymph nodes: No enlarged or pathologic adenopathy seen within the
neck.

Vascular: Moderate atheromatous disease about the visualized aortic
arch, carotid bifurcations, and carotid siphons. Hypoplastic right
vertebral artery appears occluded proximally with distal
reconstitution at the distal V2 segment. Dominant left vertebral
artery widely patent.

Limited intracranial: Unremarkable.

Visualized orbits: Prior bilateral ocular lens replacement.
Visualized globes orbital soft tissues otherwise unremarkable.

Mastoids and visualized paranasal sinuses: Scattered mucosal
thickening noted within the visualized left ethmoidal air cells and
left sphenoid sinus. Visualized paranasal sinuses are otherwise
clear. Visualized mastoid air cells and middle ear cavities are well
pneumatized and free of fluid.

Skeleton: No discrete or worrisome osseous lesions. Reversal of the
normal cervical lordosis with moderate spondylosis at C4-5 and C5-6.
Few scattered dental caries noted about the remaining dentition.

Upper chest: 8 mm spiculated nodule present at the right lung apex
(series 4, image 86). Additional 5 mm subpleural nodule at the left
upper lobe (series 4, image 107). Visualized lungs are otherwise
clear. Visualized upper chest demonstrates no other acute finding.

Other: None.
IMPRESSION: 1. Negative CT of the neck. No mass lesion or adenopathy within the
neck.
2. Few pulmonary nodules measuring up to 8 mm within the visualized
lungs as above, indeterminate. Recommend a non-contrast Chest CT at
3-6 months. If patient is high risk for malignancy, recommend an
additional non-contrast Chest CT at 18-24 months; if patient is low
risk for malignancy a non-contrast Chest CT at 18-24 months is
optional. These guidelines do not apply to immunocompromised
patients and patients with cancer. Follow up in patients with
significant comorbidities as clinically warranted. For lung cancer
screening, adhere to Lung-RADS guidelines. Reference: Radiology.
8645; 284(1):228-43.
3. Hypoplastic right vertebral artery occluded proximally distal
reconstitution at the distal V2 segment. Dominant left vertebral
artery widely patent.
4. Moderate cervical spondylosis at C4-5 and C5-6.
